# Patient Record
Sex: Female | Born: 1955 | Race: Black or African American | Hispanic: No | Marital: Married | Smoking: Never smoker
Health system: Southern US, Community
[De-identification: ages and names within clinical notes are randomized; demographics above are authoritative.]

## PROBLEM LIST (undated history)

## (undated) DIAGNOSIS — J42 Unspecified chronic bronchitis: Secondary | ICD-10-CM

## (undated) DIAGNOSIS — S83241A Other tear of medial meniscus, current injury, right knee, initial encounter: Secondary | ICD-10-CM

## (undated) DIAGNOSIS — S83249A Other tear of medial meniscus, current injury, unspecified knee, initial encounter: Secondary | ICD-10-CM

---

## 1997-09-21 ENCOUNTER — Other Ambulatory Visit: Admission: RE | Admit: 1997-09-21 | Discharge: 1997-09-21 | Payer: Self-pay | Admitting: Obstetrics

## 1998-10-09 ENCOUNTER — Other Ambulatory Visit: Admission: RE | Admit: 1998-10-09 | Discharge: 1998-10-09 | Payer: Self-pay | Admitting: Obstetrics

## 1998-12-19 ENCOUNTER — Encounter: Payer: Self-pay | Admitting: Internal Medicine

## 1998-12-19 ENCOUNTER — Encounter: Admission: RE | Admit: 1998-12-19 | Discharge: 1998-12-19 | Payer: Self-pay | Admitting: Internal Medicine

## 1999-01-04 HISTORY — PX: BREAST EXCISIONAL BIOPSY: SUR124

## 1999-01-23 ENCOUNTER — Ambulatory Visit (HOSPITAL_BASED_OUTPATIENT_CLINIC_OR_DEPARTMENT_OTHER): Admission: RE | Admit: 1999-01-23 | Discharge: 1999-01-23 | Payer: Self-pay | Admitting: General Surgery

## 1999-09-19 ENCOUNTER — Other Ambulatory Visit: Admission: RE | Admit: 1999-09-19 | Discharge: 1999-09-19 | Payer: Self-pay | Admitting: Obstetrics

## 2003-09-26 ENCOUNTER — Other Ambulatory Visit: Admission: RE | Admit: 2003-09-26 | Discharge: 2003-09-26 | Payer: Self-pay | Admitting: Internal Medicine

## 2004-09-28 ENCOUNTER — Other Ambulatory Visit: Admission: RE | Admit: 2004-09-28 | Discharge: 2004-09-28 | Payer: Self-pay | Admitting: Internal Medicine

## 2005-10-21 ENCOUNTER — Other Ambulatory Visit: Admission: RE | Admit: 2005-10-21 | Discharge: 2005-10-21 | Payer: Self-pay | Admitting: *Deleted

## 2006-10-23 ENCOUNTER — Other Ambulatory Visit: Admission: RE | Admit: 2006-10-23 | Discharge: 2006-10-23 | Payer: Self-pay | Admitting: *Deleted

## 2008-05-05 ENCOUNTER — Ambulatory Visit (HOSPITAL_COMMUNITY): Admission: RE | Admit: 2008-05-05 | Discharge: 2008-05-05 | Payer: Self-pay | Admitting: Obstetrics & Gynecology

## 2008-08-08 ENCOUNTER — Emergency Department (HOSPITAL_COMMUNITY): Admission: EM | Admit: 2008-08-08 | Discharge: 2008-08-08 | Payer: Self-pay | Admitting: Family Medicine

## 2008-09-28 ENCOUNTER — Other Ambulatory Visit: Admission: RE | Admit: 2008-09-28 | Discharge: 2008-09-28 | Payer: Self-pay | Admitting: Family Medicine

## 2009-01-17 ENCOUNTER — Ambulatory Visit (HOSPITAL_COMMUNITY): Admission: RE | Admit: 2009-01-17 | Discharge: 2009-01-17 | Payer: Self-pay | Admitting: Gastroenterology

## 2009-05-10 ENCOUNTER — Ambulatory Visit (HOSPITAL_COMMUNITY): Admission: RE | Admit: 2009-05-10 | Discharge: 2009-05-10 | Payer: Self-pay | Admitting: Family Medicine

## 2009-10-11 ENCOUNTER — Other Ambulatory Visit: Admission: RE | Admit: 2009-10-11 | Discharge: 2009-10-11 | Payer: Self-pay | Admitting: Family Medicine

## 2010-03-20 ENCOUNTER — Other Ambulatory Visit (HOSPITAL_COMMUNITY): Payer: Self-pay | Admitting: Physician Assistant

## 2010-03-20 DIAGNOSIS — Z1231 Encounter for screening mammogram for malignant neoplasm of breast: Secondary | ICD-10-CM

## 2010-05-06 LAB — URINE CULTURE: Colony Count: 70000

## 2010-05-06 LAB — POCT URINALYSIS DIP (DEVICE)
Bilirubin Urine: NEGATIVE
Glucose, UA: NEGATIVE mg/dL
Ketones, ur: 15 mg/dL — AB
Nitrite: NEGATIVE
Protein, ur: NEGATIVE mg/dL
Specific Gravity, Urine: 1.02 (ref 1.005–1.030)
Urobilinogen, UA: 0.2 mg/dL (ref 0.0–1.0)
pH: 7 (ref 5.0–8.0)

## 2010-05-15 ENCOUNTER — Ambulatory Visit (HOSPITAL_COMMUNITY): Payer: Commercial Managed Care - PPO

## 2010-05-18 ENCOUNTER — Ambulatory Visit (HOSPITAL_COMMUNITY)
Admission: RE | Admit: 2010-05-18 | Discharge: 2010-05-18 | Disposition: A | Discharge status: Home / Self Care | Payer: 59 | Source: Ambulatory Visit | Attending: Physician Assistant | Admitting: Physician Assistant

## 2010-05-18 DIAGNOSIS — Z1231 Encounter for screening mammogram for malignant neoplasm of breast: Secondary | ICD-10-CM | POA: Insufficient documentation

## 2010-05-22 ENCOUNTER — Ambulatory Visit
Payer: PRIVATE HEALTH INSURANCE | Attending: Occupational Medicine | Admitting: Rehabilitative and Restorative Service Providers"

## 2010-05-22 DIAGNOSIS — IMO0001 Reserved for inherently not codable concepts without codable children: Secondary | ICD-10-CM | POA: Insufficient documentation

## 2010-05-22 DIAGNOSIS — M545 Low back pain, unspecified: Secondary | ICD-10-CM | POA: Insufficient documentation

## 2010-05-22 DIAGNOSIS — M2569 Stiffness of other specified joint, not elsewhere classified: Secondary | ICD-10-CM | POA: Insufficient documentation

## 2010-05-25 ENCOUNTER — Ambulatory Visit: Payer: PRIVATE HEALTH INSURANCE | Admitting: Rehabilitative and Restorative Service Providers"

## 2010-05-31 ENCOUNTER — Ambulatory Visit
Payer: PRIVATE HEALTH INSURANCE | Attending: Occupational Medicine | Admitting: Rehabilitative and Restorative Service Providers"

## 2010-05-31 DIAGNOSIS — M545 Low back pain, unspecified: Secondary | ICD-10-CM | POA: Insufficient documentation

## 2010-05-31 DIAGNOSIS — M2569 Stiffness of other specified joint, not elsewhere classified: Secondary | ICD-10-CM | POA: Insufficient documentation

## 2010-05-31 DIAGNOSIS — IMO0001 Reserved for inherently not codable concepts without codable children: Secondary | ICD-10-CM | POA: Insufficient documentation

## 2010-06-01 ENCOUNTER — Ambulatory Visit: Payer: PRIVATE HEALTH INSURANCE | Admitting: Physical Therapy

## 2010-06-04 ENCOUNTER — Ambulatory Visit: Payer: PRIVATE HEALTH INSURANCE | Admitting: Rehabilitative and Restorative Service Providers"

## 2010-06-06 ENCOUNTER — Ambulatory Visit: Payer: PRIVATE HEALTH INSURANCE | Admitting: Rehabilitative and Restorative Service Providers"

## 2010-06-11 ENCOUNTER — Ambulatory Visit: Payer: PRIVATE HEALTH INSURANCE | Admitting: Rehabilitative and Restorative Service Providers"

## 2010-06-13 ENCOUNTER — Ambulatory Visit: Payer: PRIVATE HEALTH INSURANCE | Admitting: Rehabilitative and Restorative Service Providers"

## 2010-09-14 ENCOUNTER — Emergency Department (HOSPITAL_COMMUNITY)
Admission: EM | Admit: 2010-09-14 | Discharge: 2010-09-14 | Disposition: A | Discharge status: Home / Self Care | Payer: 59 | Attending: Emergency Medicine | Admitting: Emergency Medicine

## 2010-09-14 DIAGNOSIS — R21 Rash and other nonspecific skin eruption: Secondary | ICD-10-CM | POA: Insufficient documentation

## 2010-09-14 DIAGNOSIS — X58XXXA Exposure to other specified factors, initial encounter: Secondary | ICD-10-CM | POA: Insufficient documentation

## 2010-09-14 DIAGNOSIS — T7840XA Allergy, unspecified, initial encounter: Secondary | ICD-10-CM | POA: Insufficient documentation

## 2010-10-19 ENCOUNTER — Other Ambulatory Visit: Payer: Self-pay | Admitting: Physician Assistant

## 2010-10-19 ENCOUNTER — Other Ambulatory Visit (HOSPITAL_COMMUNITY)
Admission: RE | Admit: 2010-10-19 | Discharge: 2010-10-19 | Disposition: A | Discharge status: Home / Self Care | Payer: 59 | Source: Ambulatory Visit | Attending: Family Medicine | Admitting: Family Medicine

## 2010-10-19 DIAGNOSIS — Z124 Encounter for screening for malignant neoplasm of cervix: Secondary | ICD-10-CM | POA: Insufficient documentation

## 2011-03-27 ENCOUNTER — Other Ambulatory Visit (HOSPITAL_COMMUNITY): Payer: Self-pay | Admitting: Physician Assistant

## 2011-03-27 DIAGNOSIS — Z1231 Encounter for screening mammogram for malignant neoplasm of breast: Secondary | ICD-10-CM

## 2011-05-22 ENCOUNTER — Ambulatory Visit (HOSPITAL_COMMUNITY)
Admission: RE | Admit: 2011-05-22 | Discharge: 2011-05-22 | Disposition: A | Discharge status: Home / Self Care | Payer: 59 | Source: Ambulatory Visit | Attending: Physician Assistant | Admitting: Physician Assistant

## 2011-05-22 DIAGNOSIS — Z1231 Encounter for screening mammogram for malignant neoplasm of breast: Secondary | ICD-10-CM | POA: Insufficient documentation

## 2011-06-10 ENCOUNTER — Other Ambulatory Visit: Payer: Self-pay | Admitting: Occupational Medicine

## 2011-06-10 ENCOUNTER — Ambulatory Visit: Payer: Self-pay

## 2011-06-10 DIAGNOSIS — R52 Pain, unspecified: Secondary | ICD-10-CM

## 2011-06-12 ENCOUNTER — Other Ambulatory Visit (HOSPITAL_COMMUNITY): Payer: Self-pay | Admitting: Family Medicine

## 2011-06-12 DIAGNOSIS — M25462 Effusion, left knee: Secondary | ICD-10-CM

## 2011-06-14 ENCOUNTER — Other Ambulatory Visit (HOSPITAL_COMMUNITY): Payer: Self-pay

## 2011-10-23 ENCOUNTER — Other Ambulatory Visit: Payer: Self-pay | Admitting: Physician Assistant

## 2011-10-23 ENCOUNTER — Other Ambulatory Visit (HOSPITAL_COMMUNITY)
Admission: RE | Admit: 2011-10-23 | Discharge: 2011-10-23 | Disposition: A | Discharge status: Home / Self Care | Payer: 59 | Source: Ambulatory Visit | Attending: Family Medicine | Admitting: Family Medicine

## 2011-10-23 DIAGNOSIS — Z124 Encounter for screening for malignant neoplasm of cervix: Secondary | ICD-10-CM | POA: Insufficient documentation

## 2012-04-20 ENCOUNTER — Other Ambulatory Visit (HOSPITAL_COMMUNITY): Payer: Self-pay | Admitting: Physician Assistant

## 2012-04-20 DIAGNOSIS — Z803 Family history of malignant neoplasm of breast: Secondary | ICD-10-CM

## 2012-05-22 ENCOUNTER — Ambulatory Visit (HOSPITAL_COMMUNITY)
Admission: RE | Admit: 2012-05-22 | Discharge: 2012-05-22 | Disposition: A | Discharge status: Home / Self Care | Payer: 59 | Source: Ambulatory Visit | Attending: Physician Assistant | Admitting: Physician Assistant

## 2012-05-22 DIAGNOSIS — Z1231 Encounter for screening mammogram for malignant neoplasm of breast: Secondary | ICD-10-CM | POA: Insufficient documentation

## 2012-05-22 DIAGNOSIS — Z803 Family history of malignant neoplasm of breast: Secondary | ICD-10-CM

## 2013-04-19 ENCOUNTER — Other Ambulatory Visit (HOSPITAL_COMMUNITY): Payer: Self-pay | Admitting: Physician Assistant

## 2013-04-19 DIAGNOSIS — Z1231 Encounter for screening mammogram for malignant neoplasm of breast: Secondary | ICD-10-CM

## 2013-05-24 ENCOUNTER — Ambulatory Visit (HOSPITAL_COMMUNITY)
Admission: RE | Admit: 2013-05-24 | Discharge: 2013-05-24 | Disposition: A | Discharge status: Home / Self Care | Payer: 59 | Source: Ambulatory Visit | Attending: Physician Assistant | Admitting: Physician Assistant

## 2013-05-24 DIAGNOSIS — Z1231 Encounter for screening mammogram for malignant neoplasm of breast: Secondary | ICD-10-CM | POA: Insufficient documentation

## 2013-06-29 ENCOUNTER — Ambulatory Visit
Admission: RE | Admit: 2013-06-29 | Discharge: 2013-06-29 | Disposition: A | Discharge status: Home / Self Care | Payer: 59 | Source: Ambulatory Visit | Attending: Physician Assistant | Admitting: Physician Assistant

## 2013-06-29 ENCOUNTER — Other Ambulatory Visit: Payer: Self-pay | Admitting: Physician Assistant

## 2013-06-29 DIAGNOSIS — M25562 Pain in left knee: Secondary | ICD-10-CM

## 2013-07-09 ENCOUNTER — Other Ambulatory Visit (HOSPITAL_COMMUNITY): Payer: Self-pay | Admitting: Sports Medicine

## 2013-07-09 DIAGNOSIS — M25562 Pain in left knee: Secondary | ICD-10-CM

## 2013-07-14 ENCOUNTER — Ambulatory Visit (HOSPITAL_COMMUNITY)
Admission: RE | Admit: 2013-07-14 | Discharge: 2013-07-14 | Disposition: A | Discharge status: Home / Self Care | Payer: 59 | Source: Ambulatory Visit | Attending: Sports Medicine | Admitting: Sports Medicine

## 2013-07-14 DIAGNOSIS — M2390 Unspecified internal derangement of unspecified knee: Secondary | ICD-10-CM | POA: Insufficient documentation

## 2013-07-14 DIAGNOSIS — M25562 Pain in left knee: Secondary | ICD-10-CM

## 2013-07-14 DIAGNOSIS — M25469 Effusion, unspecified knee: Secondary | ICD-10-CM | POA: Insufficient documentation

## 2013-07-14 DIAGNOSIS — M25569 Pain in unspecified knee: Secondary | ICD-10-CM | POA: Insufficient documentation

## 2013-11-03 ENCOUNTER — Other Ambulatory Visit (HOSPITAL_COMMUNITY)
Admission: RE | Admit: 2013-11-03 | Discharge: 2013-11-03 | Disposition: A | Discharge status: Home / Self Care | Payer: 59 | Source: Ambulatory Visit | Attending: Family Medicine | Admitting: Family Medicine

## 2013-11-03 DIAGNOSIS — Z124 Encounter for screening for malignant neoplasm of cervix: Secondary | ICD-10-CM | POA: Insufficient documentation

## 2013-11-04 ENCOUNTER — Other Ambulatory Visit: Payer: Self-pay | Admitting: Physician Assistant

## 2013-11-10 LAB — CYTOLOGY - PAP

## 2014-05-02 ENCOUNTER — Other Ambulatory Visit (HOSPITAL_COMMUNITY): Payer: Self-pay | Admitting: Physician Assistant

## 2014-05-02 DIAGNOSIS — Z1231 Encounter for screening mammogram for malignant neoplasm of breast: Secondary | ICD-10-CM

## 2014-05-13 ENCOUNTER — Ambulatory Visit (HOSPITAL_COMMUNITY): Payer: 59

## 2014-05-27 ENCOUNTER — Ambulatory Visit (HOSPITAL_COMMUNITY)
Admission: RE | Admit: 2014-05-27 | Discharge: 2014-05-27 | Disposition: A | Discharge status: Home / Self Care | Payer: 59 | Source: Ambulatory Visit | Attending: Physician Assistant | Admitting: Physician Assistant

## 2014-05-27 DIAGNOSIS — Z1231 Encounter for screening mammogram for malignant neoplasm of breast: Secondary | ICD-10-CM | POA: Diagnosis present

## 2014-06-04 ENCOUNTER — Emergency Department (HOSPITAL_COMMUNITY)
Admission: EM | Admit: 2014-06-04 | Discharge: 2014-06-04 | Disposition: A | Discharge status: Home / Self Care | Payer: 59 | Source: Home / Self Care | Attending: Family Medicine | Admitting: Family Medicine

## 2014-06-04 ENCOUNTER — Encounter (HOSPITAL_COMMUNITY): Payer: Self-pay | Admitting: *Deleted

## 2014-06-04 DIAGNOSIS — R0982 Postnasal drip: Secondary | ICD-10-CM | POA: Diagnosis not present

## 2014-06-04 DIAGNOSIS — J069 Acute upper respiratory infection, unspecified: Secondary | ICD-10-CM | POA: Diagnosis not present

## 2014-06-04 DIAGNOSIS — B9789 Other viral agents as the cause of diseases classified elsewhere: Principal | ICD-10-CM

## 2014-06-04 HISTORY — DX: Unspecified chronic bronchitis: J42

## 2014-06-04 MED ORDER — GUAIFENESIN-CODEINE 100-10 MG/5ML PO SOLN: 5.0000 mL | Freq: Four times a day (QID) | ORAL | Status: DC | PRN

## 2014-06-04 MED ORDER — IPRATROPIUM BROMIDE 0.06 % NA SOLN: 2.0000 | Freq: Four times a day (QID) | NASAL | Status: DC

## 2014-06-04 MED ORDER — FLUTICASONE PROPIONATE 50 MCG/ACT NA SUSP: 2.0000 | Freq: Every day | NASAL | Status: DC

## 2014-06-04 NOTE — ED Notes (Signed)
Started with sore throat 3 days ago; now has productive cough, chest congestion, and c/o wheezing.  Has hx chronic bronchitis.  Does not use inhalers.  Has been taking Nyquil & Advil Cold & Sinus.  Denies fevers.

## 2014-06-04 NOTE — ED Provider Notes (Signed)
CSN: [id number]     Arrival date & time 06/04/14  0915 History   First MD Initiated Contact with Patient 06/04/14 [phone]     Chief Complaint  Patient presents with  . Cough  . Wheezing   (Consider location/radiation/quality/duration/timing/severity/associated sxs/prior Treatment) HPI 5 days ago developed sore throat. Associated w/ runny nose and cough. Worse at night. Sore throat stopped a couple days ago. Slowly improving. Advil, mucinex, and nyquil w/ minimal improvement. Denies fevers, shortness of breath, chest pain, palpitations, dyspnea, syncope, decreased appetite and oral intake, abdominal pain, nausea, vomiting, diarrhea, dysuria, frequency, back pain.   Past Medical History  Diagnosis Date  . Chronic bronchitis    History reviewed. No pertinent past surgical history. Family History  Problem Relation Age of Onset  . Stroke Mother   . Heart failure Father    History  Substance Use Topics  . Smoking status: Never Smoker   . Smokeless tobacco: Not on file  . Alcohol Use: No   OB History    No data available     Review of Systems Per HPI with all other pertinent systems negative.   Allergies  Review of patient's allergies indicates no known allergies.  Home Medications   Prior to Admission medications   Medication Sig Start Date End Date Taking? Authorizing Provider  Calcium Carbonate-Vitamin D (CALCIUM + D PO) Take by mouth daily.   Yes Historical Provider, MD  Omega-3 Fatty Acids (FISH OIL) 1000 MG CAPS Take by mouth daily.   Yes Historical Provider, MD  Ospemifene 60 MG TABS Take by mouth daily.   Yes Historical Provider, MD  Prenatal Vit-Fe Fumarate-FA (M-VIT PO) Take by mouth daily.   Yes Historical Provider, MD  fluticasone (FLONASE) 50 MCG/ACT nasal spray Place 2 sprays into both nostrils at bedtime. 06/04/14   Waldemar Dickens, MD  guaiFENesin-codeine 100-10 MG/5ML syrup Take 5-10 mLs by mouth every 6 (six) hours as needed for cough. 06/04/14   Waldemar Dickens, MD   ipratropium (ATROVENT) 0.06 % nasal spray Place 2 sprays into both nostrils 4 (four) times daily. 06/04/14   Waldemar Dickens, MD   BP 138/82 mmHg  Pulse 90  Temp(Src) 98.8 F (37.1 C) (Oral)  Resp 16  SpO2 95% Physical Exam Physical Exam  Constitutional: oriented to person, place, and time. appears well-developed and well-nourished. No distress.  HENT:  Head: Normocephalic and atraumatic.  Boggy nasal turbinates, no stridor, minimal pharyngeal cobblestoning with 0-1+ tonsils without exudate. Eyes: EOMI. PERRL.  Neck: Normal range of motion.  Cardiovascular: RRR, no m/r/g, 2+ distal pulses,  Pulmonary/Chest: Effort normal and breath sounds normal. No respiratory distress.  Abdominal: Soft. Bowel sounds are normal. NonTTP, no distension.  Musculoskeletal: Normal range of motion. Non ttp, no effusion.  Neurological: alert and oriented to person, place, and time.  Skin: Skin is warm. No rash noted. non diaphoretic.  Psychiatric: normal mood and affect. behavior is normal. Judgment and thought content normal.   ED Course  Procedures (including critical care time) Labs Review Labs Reviewed - No data to display  Imaging Review No results found.   MDM   1. Viral URI with cough   2. Post-nasal drip    No need for antibiotics. Start nasal Atrovent, Flonase, Robitussin-AC, daily allergy pill such as Zyrtec.    Waldemar Dickens, MD 06/04/14 [phone]

## 2014-06-04 NOTE — Discharge Instructions (Signed)
The majority of her symptoms are caused by a viral upper respiratory tract infection which causes postnasal drip and drainage into the lungs causing the cough. Please rate using the nasal Atrovent to help dry paranasal secretions, Flonase at night to decrease her nasal inflammation, a daily allergy pill such as Zyrtec or Allegra, and the cough medicine at night to help you sleep. This should continue to improve everyday. There is no signs of pneumonia or severe bronchitis requiring antibiotics. Please let us know if you get worse.

## 2014-06-04 NOTE — ED Notes (Addendum)
eval this patient on arrival w c/o  "wheezing" states she has been sick x 1 week w cough, feels as if this is in her chest, and is developing bronchitis, and has started to wheeze. Speaking in complete sentences w/o observable SOB or distress

## 2014-12-28 ENCOUNTER — Other Ambulatory Visit (HOSPITAL_COMMUNITY): Payer: Self-pay | Admitting: Sports Medicine

## 2014-12-28 DIAGNOSIS — M25561 Pain in right knee: Secondary | ICD-10-CM

## 2015-01-05 ENCOUNTER — Ambulatory Visit (HOSPITAL_COMMUNITY)
Admission: RE | Admit: 2015-01-05 | Discharge: 2015-01-05 | Disposition: A | Discharge status: Home / Self Care | Payer: 59 | Source: Ambulatory Visit | Attending: Sports Medicine | Admitting: Sports Medicine

## 2015-01-05 DIAGNOSIS — S83241A Other tear of medial meniscus, current injury, right knee, initial encounter: Secondary | ICD-10-CM | POA: Diagnosis not present

## 2015-01-05 DIAGNOSIS — M25461 Effusion, right knee: Secondary | ICD-10-CM | POA: Insufficient documentation

## 2015-01-05 DIAGNOSIS — X58XXXA Exposure to other specified factors, initial encounter: Secondary | ICD-10-CM | POA: Insufficient documentation

## 2015-01-05 DIAGNOSIS — M25561 Pain in right knee: Secondary | ICD-10-CM | POA: Insufficient documentation

## 2015-01-05 DIAGNOSIS — M7121 Synovial cyst of popliteal space [Baker], right knee: Secondary | ICD-10-CM | POA: Diagnosis not present

## 2015-01-10 ENCOUNTER — Encounter (HOSPITAL_BASED_OUTPATIENT_CLINIC_OR_DEPARTMENT_OTHER): Payer: Self-pay | Admitting: *Deleted

## 2015-01-10 DIAGNOSIS — J42 Unspecified chronic bronchitis: Secondary | ICD-10-CM | POA: Diagnosis present

## 2015-01-10 DIAGNOSIS — S83249A Other tear of medial meniscus, current injury, unspecified knee, initial encounter: Secondary | ICD-10-CM | POA: Diagnosis present

## 2015-01-10 DIAGNOSIS — S83241A Other tear of medial meniscus, current injury, right knee, initial encounter: Secondary | ICD-10-CM | POA: Diagnosis present

## 2015-01-10 NOTE — H&P (Signed)
Monique Jimenez is an 59 y.o. female.   Chief Complaint: right knee pain and catching HPI: Monique Jimenez comes to the office with concerns mostly about her right knee.  Swelling, stiffness, deep seated pain with  popping and catching.  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  Previous injection this fall gave her some transient improvement but nothing significant.  She is frustrated with continued knee pain. MRI radial tear of the medial meniscus  Past Medical History  Diagnosis Date  . Chronic bronchitis ([place])   . Medial meniscus tear     right  . Acute medial meniscus tear of right knee     History reviewed. No pertinent past surgical history.  Family History  Problem Relation Age of Onset  . Stroke Mother   . Heart failure Father    Social History:  reports that she has never smoked. She does not have any smokeless tobacco history on file. She reports that she does not drink alcohol or use illicit drugs.  Allergies: No Known Allergies  No current facility-administered medications for this encounter.  Current outpatient prescriptions:  .  Calcium Carbonate-Vitamin D (CALCIUM + D PO), Take by mouth daily., Disp: , Rfl:  .  Omega-3 Fatty Acids (FISH OIL) 1000 MG CAPS, Take by mouth daily., Disp: , Rfl:  .  Prenatal Vit-Fe Fumarate-FA (M-VIT PO), Take by mouth daily., Disp: , Rfl:  No prescriptions prior to admission    No results found for this or any previous visit (from the past 48 hour(s)). No results found.  Review of Systems  Constitutional: Negative.   HENT: Negative.   Eyes: Negative.   Respiratory: Negative.   Cardiovascular: Negative.   Gastrointestinal: Negative.   Genitourinary: Negative.   Musculoskeletal: Positive for joint pain.       Right knee pain and clicking  Skin: Negative.   Neurological: Negative.   Endo/Heme/Allergies: Negative.   Psychiatric/Behavioral: Negative.     Height 5\' 2"  (1.575  m), weight 74.39 kg (164 lb). Physical Exam  Constitutional: She is oriented to person, place, and time. She appears well-developed and well-nourished.  HENT:  Head: Normocephalic and atraumatic.  Eyes: Conjunctivae and EOM are normal. Pupils are equal, round, and reactive to light.  Neck: Neck supple.  Cardiovascular: Normal rate.   Respiratory: Effort normal.  GI: Soft.  Genitourinary:  Not pertinent to current symptomatology therefore not examined.  Musculoskeletal:  Examination of the right knee shows a trace to 1+ effusion, 1+ retropatellar grind, discreet tenderness of the medial joint line.  Positive McMurray.  Full motion, pain to flexion.  The knees is stable.  She is neurovascularly intact distally.    Neurological: She is alert and oriented to person, place, and time.  Skin: Skin is warm and dry.  Psychiatric: She has a normal mood and affect. Her behavior is normal.     Assessment Principal Problem:   Acute medial meniscus tear of right knee Active Problems:   Medial meniscus tear   Chronic bronchitis (HCC)   Plan Right knee arthroscopy with partial medial meniscectomy.   The risks, benefits, and possible complications of the procedure were discussed in detail with the patient.  The patient is without question.  Shirlette Scarber J 01/10/2015, 2:10 PM

## 2015-01-11 ENCOUNTER — Encounter (HOSPITAL_BASED_OUTPATIENT_CLINIC_OR_DEPARTMENT_OTHER)
Admission: RE | Disposition: A | Discharge status: Home / Self Care | Payer: Self-pay | Source: Ambulatory Visit | Attending: Orthopedic Surgery

## 2015-01-11 ENCOUNTER — Encounter (HOSPITAL_BASED_OUTPATIENT_CLINIC_OR_DEPARTMENT_OTHER): Payer: Self-pay | Admitting: *Deleted

## 2015-01-11 ENCOUNTER — Ambulatory Visit (HOSPITAL_BASED_OUTPATIENT_CLINIC_OR_DEPARTMENT_OTHER): Payer: 59 | Admitting: Certified Registered"

## 2015-01-11 ENCOUNTER — Ambulatory Visit (HOSPITAL_BASED_OUTPATIENT_CLINIC_OR_DEPARTMENT_OTHER)
Admission: RE | Admit: 2015-01-11 | Discharge: 2015-01-11 | Disposition: A | Discharge status: Home / Self Care | Payer: 59 | Source: Ambulatory Visit | Attending: Orthopedic Surgery | Admitting: Orthopedic Surgery

## 2015-01-11 DIAGNOSIS — M23221 Derangement of posterior horn of medial meniscus due to old tear or injury, right knee: Secondary | ICD-10-CM | POA: Insufficient documentation

## 2015-01-11 DIAGNOSIS — J42 Unspecified chronic bronchitis: Secondary | ICD-10-CM | POA: Insufficient documentation

## 2015-01-11 DIAGNOSIS — Z79899 Other long term (current) drug therapy: Secondary | ICD-10-CM | POA: Insufficient documentation

## 2015-01-11 DIAGNOSIS — S83241A Other tear of medial meniscus, current injury, right knee, initial encounter: Secondary | ICD-10-CM | POA: Diagnosis present

## 2015-01-11 DIAGNOSIS — S83249A Other tear of medial meniscus, current injury, unspecified knee, initial encounter: Secondary | ICD-10-CM | POA: Diagnosis present

## 2015-01-11 DIAGNOSIS — M94261 Chondromalacia, right knee: Secondary | ICD-10-CM | POA: Insufficient documentation

## 2015-01-11 DIAGNOSIS — M23251 Derangement of posterior horn of lateral meniscus due to old tear or injury, right knee: Secondary | ICD-10-CM | POA: Insufficient documentation

## 2015-01-11 HISTORY — DX: Other tear of medial meniscus, current injury, right knee, initial encounter: S83.241A

## 2015-01-11 HISTORY — PX: KNEE ARTHROSCOPY WITH MEDIAL MENISECTOMY: SHX5651

## 2015-01-11 HISTORY — PX: KNEE ARTHROSCOPY WITH LATERAL MENISECTOMY: SHX6193

## 2015-01-11 HISTORY — DX: Other tear of medial meniscus, current injury, unspecified knee, initial encounter: S83.249A

## 2015-01-11 SURGERY — ARTHROSCOPY, KNEE, WITH MEDIAL MENISCECTOMY
Anesthesia: General | Site: Knee | Laterality: Right

## 2015-01-11 MED ORDER — LIDOCAINE HCL (CARDIAC) 20 MG/ML IV SOLN
INTRAVENOUS | Status: DC | PRN
  Administered 2015-01-11: 60 mg via INTRAVENOUS

## 2015-01-11 MED ORDER — PHENYLEPHRINE HCL 10 MG/ML IJ SOLN
INTRAMUSCULAR | Status: AC
  Filled 2015-01-11: qty 1

## 2015-01-11 MED ORDER — BUPIVACAINE-EPINEPHRINE (PF) 0.5% -1:200000 IJ SOLN
INTRAMUSCULAR | Status: DC | PRN
  Administered 2015-01-11: 30 mL

## 2015-01-11 MED ORDER — MIDAZOLAM HCL 2 MG/2ML IJ SOLN
INTRAMUSCULAR | Status: AC
  Filled 2015-01-11: qty 2

## 2015-01-11 MED ORDER — SODIUM CHLORIDE 0.9 % IR SOLN
Status: DC | PRN
  Administered 2015-01-11: 2000 mL

## 2015-01-11 MED ORDER — HYDROMORPHONE HCL 1 MG/ML IJ SOLN
INTRAMUSCULAR | Status: AC
  Filled 2015-01-11: qty 1

## 2015-01-11 MED ORDER — LIDOCAINE-EPINEPHRINE (PF) 1.5 %-1:200000 IJ SOLN
INTRAMUSCULAR | Status: DC | PRN
  Administered 2015-01-11: 30 mL

## 2015-01-11 MED ORDER — EPHEDRINE SULFATE 50 MG/ML IJ SOLN
INTRAMUSCULAR | Status: DC | PRN
  Administered 2015-01-11: 10 mg via INTRAVENOUS

## 2015-01-11 MED ORDER — MIDAZOLAM HCL 2 MG/2ML IJ SOLN
1.0000 mg | INTRAMUSCULAR | Status: DC | PRN
  Administered 2015-01-11 (×2): 2 mg via INTRAVENOUS

## 2015-01-11 MED ORDER — DEXAMETHASONE SODIUM PHOSPHATE 4 MG/ML IJ SOLN
INTRAMUSCULAR | Status: DC | PRN
  Administered 2015-01-11: 10 mg via INTRAVENOUS

## 2015-01-11 MED ORDER — FENTANYL CITRATE (PF) 100 MCG/2ML IJ SOLN
50.0000 ug | INTRAMUSCULAR | Status: AC | PRN
  Administered 2015-01-11: 100 ug via INTRAVENOUS
  Administered 2015-01-11 (×2): 50 ug via INTRAVENOUS

## 2015-01-11 MED ORDER — PROPOFOL 10 MG/ML IV BOLUS
INTRAVENOUS | Status: DC | PRN
Start: 2015-01-11 — End: 2015-01-11
  Administered 2015-01-11: 150 mg via INTRAVENOUS

## 2015-01-11 MED ORDER — HYDROCODONE-ACETAMINOPHEN 5-325 MG PO TABS: ORAL_TABLET | ORAL | Status: AC

## 2015-01-11 MED ORDER — BUPIVACAINE-EPINEPHRINE (PF) 0.25% -1:200000 IJ SOLN
INTRAMUSCULAR | Status: AC
  Filled 2015-01-11: qty 60

## 2015-01-11 MED ORDER — CHLORHEXIDINE GLUCONATE 4 % EX LIQD: 60.0000 mL | Freq: Once | CUTANEOUS | Status: DC

## 2015-01-11 MED ORDER — EPHEDRINE SULFATE 50 MG/ML IJ SOLN
INTRAMUSCULAR | Status: AC
  Filled 2015-01-11: qty 1

## 2015-01-11 MED ORDER — LACTATED RINGERS IV SOLN
INTRAVENOUS | Status: DC
  Administered 2015-01-11 (×2): via INTRAVENOUS

## 2015-01-11 MED ORDER — MEPERIDINE HCL 25 MG/ML IJ SOLN: 6.2500 mg | INTRAMUSCULAR | Status: DC | PRN

## 2015-01-11 MED ORDER — GLYCOPYRROLATE 0.2 MG/ML IJ SOLN: 0.2000 mg | Freq: Once | INTRAMUSCULAR | Status: DC | PRN

## 2015-01-11 MED ORDER — FENTANYL CITRATE (PF) 100 MCG/2ML IJ SOLN
INTRAMUSCULAR | Status: AC
  Filled 2015-01-11: qty 2

## 2015-01-11 MED ORDER — ONDANSETRON HCL 4 MG/2ML IJ SOLN: 4.0000 mg | Freq: Once | INTRAMUSCULAR | Status: DC | PRN

## 2015-01-11 MED ORDER — CEFAZOLIN SODIUM-DEXTROSE 2-3 GM-% IV SOLR
2.0000 g | INTRAVENOUS | Status: AC
  Administered 2015-01-11: 2 g via INTRAVENOUS

## 2015-01-11 MED ORDER — HYDROMORPHONE HCL 1 MG/ML IJ SOLN
0.2500 mg | INTRAMUSCULAR | Status: DC | PRN
  Administered 2015-01-11 (×3): 0.5 mg via INTRAVENOUS

## 2015-01-11 MED ORDER — SCOPOLAMINE 1 MG/3DAYS TD PT72: 1.0000 | MEDICATED_PATCH | Freq: Once | TRANSDERMAL | Status: DC

## 2015-01-11 MED ORDER — PHENYLEPHRINE HCL 10 MG/ML IJ SOLN
INTRAMUSCULAR | Status: DC | PRN
  Administered 2015-01-11: 80 ug via INTRAVENOUS
  Administered 2015-01-11: 40 ug via INTRAVENOUS

## 2015-01-11 SURGICAL SUPPLY — 56 items
APL SKNCLS STERI-STRIP NONHPOA (GAUZE/BANDAGES/DRESSINGS)
BANDAGE ELASTIC 6 VELCRO ST LF (GAUZE/BANDAGES/DRESSINGS) ×3 IMPLANT
BANDAGE ESMARK 6X9 LF (GAUZE/BANDAGES/DRESSINGS) IMPLANT
BENZOIN TINCTURE PRP APPL 2/3 (GAUZE/BANDAGES/DRESSINGS) IMPLANT
BLADE CUDA GRT WHITE 3.5 (BLADE) ×2 IMPLANT
BLADE CUTTER GATOR 3.5 (BLADE) ×2 IMPLANT
BLADE GREAT WHITE 4.2 (BLADE) ×2 IMPLANT
BLADE SURG 15 STRL LF DISP TIS (BLADE) IMPLANT
BLADE SURG 15 STRL SS (BLADE)
BNDG CMPR 9X6 STRL LF SNTH (GAUZE/BANDAGES/DRESSINGS)
BNDG COHESIVE 4X5 TAN STRL (GAUZE/BANDAGES/DRESSINGS) IMPLANT
BNDG ESMARK 6X9 LF (GAUZE/BANDAGES/DRESSINGS)
DRAPE ARTHROSCOPY W/POUCH 90 (DRAPES) ×3 IMPLANT
DURAPREP 26ML APPLICATOR (WOUND CARE) ×3 IMPLANT
GAUZE SPONGE 4X4 12PLY STRL (GAUZE/BANDAGES/DRESSINGS) ×3 IMPLANT
GAUZE XEROFORM 1X8 LF (GAUZE/BANDAGES/DRESSINGS) ×3 IMPLANT
GLOVE BIO SURGEON STRL SZ7 (GLOVE) ×3 IMPLANT
GLOVE BIOGEL PI IND STRL 7.0 (GLOVE) ×4 IMPLANT
GLOVE BIOGEL PI IND STRL 7.5 (GLOVE) ×2 IMPLANT
GLOVE BIOGEL PI INDICATOR 7.0 (GLOVE) ×3
GLOVE BIOGEL PI INDICATOR 7.5 (GLOVE) ×1
GLOVE ECLIPSE 6.5 STRL STRAW (GLOVE) ×2 IMPLANT
GLOVE SS BIOGEL STRL SZ 7.5 (GLOVE) ×2 IMPLANT
GLOVE SUPERSENSE BIOGEL SZ 7.5 (GLOVE) ×1
GOWN STRL REUS W/ TWL LRG LVL3 (GOWN DISPOSABLE) ×6 IMPLANT
GOWN STRL REUS W/TWL LRG LVL3 (GOWN DISPOSABLE) ×9
HOLDER KNEE FOAM BLUE (MISCELLANEOUS) ×3 IMPLANT
K-WIRE .062X4 (WIRE) IMPLANT
KNEE WRAP E Z 3 GEL PACK (MISCELLANEOUS) ×3 IMPLANT
MANIFOLD NEPTUNE II (INSTRUMENTS) IMPLANT
NDL SAFETY ECLIPSE 18X1.5 (NEEDLE) ×3 IMPLANT
NEEDLE HYPO 18GX1.5 SHARP (NEEDLE) ×3
NEEDLE HYPO 22GX1.5 SAFETY (NEEDLE) IMPLANT
PACK ARTHROSCOPY DSU (CUSTOM PROCEDURE TRAY) ×3 IMPLANT
PACK BASIN DAY SURGERY FS (CUSTOM PROCEDURE TRAY) ×3 IMPLANT
PAD ALCOHOL SWAB (MISCELLANEOUS) ×2 IMPLANT
SET ARTHROSCOPY TUBING (MISCELLANEOUS) ×3
SET ARTHROSCOPY TUBING LN (MISCELLANEOUS) ×2 IMPLANT
STRIP CLOSURE SKIN 1/2X4 (GAUZE/BANDAGES/DRESSINGS) IMPLANT
SUCTION FRAZIER TIP 10 FR DISP (SUCTIONS) IMPLANT
SUT ETHILON 4 0 PS 2 18 (SUTURE) ×3 IMPLANT
SUT FIBERWIRE #2 38 T-5 BLUE (SUTURE)
SUT PDS AB 0 CT 36 (SUTURE) IMPLANT
SUT PROLENE 3 0 PS 2 (SUTURE) IMPLANT
SUT VIC AB 0 CT1 18XCR BRD 8 (SUTURE) IMPLANT
SUT VIC AB 0 CT1 8-18 (SUTURE)
SUT VIC AB 2-0 CT1 27 (SUTURE)
SUT VIC AB 2-0 CT1 TAPERPNT 27 (SUTURE) IMPLANT
SUT VIC AB 3-0 PS1 18 (SUTURE)
SUT VIC AB 3-0 PS1 18XBRD (SUTURE) IMPLANT
SUTURE FIBERWR #2 38 T-5 BLUE (SUTURE) IMPLANT
SYR 20CC LL (SYRINGE) IMPLANT
SYR 5ML LL (SYRINGE) ×3 IMPLANT
TOWEL OR 17X24 6PK STRL BLUE (TOWEL DISPOSABLE) ×3 IMPLANT
WAND STAR VAC 90 (SURGICAL WAND) IMPLANT
WATER STERILE IRR 1000ML POUR (IV SOLUTION) ×3 IMPLANT

## 2015-01-11 NOTE — Anesthesia Preprocedure Evaluation (Signed)
Anesthesia Evaluation  Patient identified by MRN, date of birth, ID band Patient awake    Reviewed: Allergy & Precautions, NPO status , Patient's Chart, lab work & pertinent test results  Airway Mallampati: I  TM Distance: >3 FB Neck ROM: Full    Dental   Pulmonary    Pulmonary exam normal        Cardiovascular Normal cardiovascular exam     Neuro/Psych    GI/Hepatic   Endo/Other    Renal/GU      Musculoskeletal   Abdominal   Peds  Hematology   Anesthesia Other Findings   Reproductive/Obstetrics                             Anesthesia Physical Anesthesia Plan  ASA: II  Anesthesia Plan: General   Post-op Pain Management: MAC Combined w/ Regional for Post-op pain   Induction: Intravenous  Airway Management Planned: LMA  Additional Equipment:   Intra-op Plan:   Post-operative Plan: Extubation in OR  Informed Consent: I have reviewed the patients History and Physical, chart, labs and discussed the procedure including the risks, benefits and alternatives for the proposed anesthesia with the patient or authorized representative who has indicated his/her understanding and acceptance.     Plan Discussed with: CRNA and Surgeon  Anesthesia Plan Comments:         Anesthesia Quick Evaluation

## 2015-01-11 NOTE — Discharge Instructions (Signed)
° ° °  Regional Anesthesia Blocks ° °1. Numbness or the inability to move the "blocked" extremity may last from 3-48 hours after placement. The length of time depends on the medication injected and your individual response to the medication. If the numbness is not going away after 48 hours, call your surgeon. ° °2. The extremity that is blocked will need to be protected until the numbness is gone and the  Strength has returned. Because you cannot feel it, you will need to take extra care to avoid injury. Because it may be weak, you may have difficulty moving it or using it. You may not know what position it is in without looking at it while the block is in effect. ° °3. For blocks in the legs and feet, returning to weight bearing and walking needs to be done carefully. You will need to wait until the numbness is entirely gone and the strength has returned. You should be able to move your leg and foot normally before you try and bear weight or walk. You will need someone to be with you when you first try to ensure you do not fall and possibly risk injury. ° °4. Bruising and tenderness at the needle site are common side effects and will resolve in a few days. ° °5. Persistent numbness or new problems with movement should be communicated to the surgeon or the [facility] ([phone])/  Surgery Center ([phone]). ° ° ° °Post Anesthesia Home Care Instructions ° °Activity: °Get plenty of rest for the remainder of the day. A responsible adult should stay with you for 24 hours following the procedure.  °For the next 24 hours, DO NOT: °-Drive a car °-Operate machinery °-Drink alcoholic beverages °-Take any medication unless instructed by your physician °-Make any legal decisions or sign important papers. ° °Meals: °Start with liquid foods such as gelatin or soup. Progress to regular foods as tolerated. Avoid greasy, spicy, heavy foods. If nausea and/or vomiting occur, drink only clear liquids until  the nausea and/or vomiting subsides. Call your physician if vomiting continues. ° °Special Instructions/Symptoms: °Your throat may feel dry or sore from the anesthesia or the breathing tube placed in your throat during surgery. If this causes discomfort, gargle with warm salt water. The discomfort should disappear within 24 hours. ° °If you had a scopolamine patch placed behind your ear for the management of post- operative nausea and/or vomiting: ° °1. The medication in the patch is effective for 72 hours, after which it should be removed.  Wrap patch in a tissue and discard in the trash. Wash hands thoroughly with soap and water. °2. You may remove the patch earlier than 72 hours if you experience unpleasant side effects which may include dry mouth, dizziness or visual disturbances. °3. Avoid touching the patch. Wash your hands with soap and water after contact with the patch. °  ° °

## 2015-01-11 NOTE — Interval H&P Note (Signed)
History and Physical Interval Note:  01/11/2015 8:01 AM  Monique Jimenez  has presented today for surgery, with the diagnosis of Right knee meniscus tear  The various methods of treatment have been discussed with the patient and family. After consideration of risks, benefits and other options for treatment, the patient has consented to  Procedure(s): RIGHT KNEE ARTHROSCOPY WITH MEDIAL MENISECTOMY (Right) as a surgical intervention .  The patient's history has been reviewed, patient examined, no change in status, stable for surgery.  I have reviewed the patient's chart and labs.  Questions were answered to the patient's satisfaction.     Elsie Saas A

## 2015-01-11 NOTE — Anesthesia Postprocedure Evaluation (Signed)
Anesthesia Post Note  Patient: Monique Jimenez  Procedure(s) Performed: Procedure(s) (LRB): RIGHT KNEE ARTHROSCOPY WITH PARTIAL MEDIAL MENISECTOMY AND PARTIAL  LATERAL MENISECTOMY (Right) KNEE ARTHROSCOPY WITH LATERAL MENISECTOMY (Right)  Patient location during evaluation: PACU Anesthesia Type: General Level of consciousness: awake and alert Pain management: pain level controlled Vital Signs Assessment: post-procedure vital signs reviewed and stable Respiratory status: spontaneous breathing, nonlabored ventilation, respiratory function stable and patient connected to nasal cannula oxygen Cardiovascular status: blood pressure returned to baseline and stable Postop Assessment: no signs of nausea or vomiting Anesthetic complications: no    Last Vitals:  Filed Vitals:   01/11/15 1400 01/11/15 1446  BP: 125/70 135/65  Pulse: 87 73  Temp:  36.7 C  Resp: 14 18    Last Pain:  Filed Vitals:   01/11/15 1447  PainSc: 2                  [name] DAVID

## 2015-01-11 NOTE — Transfer of Care (Signed)
Immediate Anesthesia Transfer of Care Note  Patient: Monique Jimenez  Procedure(s) Performed: Procedure(s): RIGHT KNEE ARTHROSCOPY WITH PARTIAL MEDIAL MENISECTOMY AND PARTIAL  LATERAL MENISECTOMY (Right) KNEE ARTHROSCOPY WITH LATERAL MENISECTOMY (Right)  Patient Location: PACU  Anesthesia Type:GA combined with regional for post-op pain  Level of Consciousness: sedated and pateint uncooperative  Airway & Oxygen Therapy: Patient Spontanous Breathing and Patient connected to face mask oxygen  Post-op Assessment: Report given to RN and Post -op Vital signs reviewed and stable  Post vital signs: Reviewed and stable  Last Vitals:  Filed Vitals:   01/11/15 1336 01/11/15 1337  BP: 123/67   Pulse:  93  Temp:    Resp:  16    Complications: No apparent anesthesia complications

## 2015-01-11 NOTE — Anesthesia Procedure Notes (Addendum)
Anesthesia Regional Block:  Knee block  Pre-Anesthetic Checklist: ,, timeout performed, Correct Patient, Correct Site, Correct Laterality, Correct Procedure, Correct Position, site marked, Risks and benefits discussed,  Surgical consent,  Pre-op evaluation,  At surgeon's request and post-op pain management  Laterality: Right  Prep: Betadine       Needles:  Injection technique: Single-shot  Needle Type: Other     Needle Length: 1.5cm  Needle Gauge: 25 and 25 G    Additional Needles: #22 spinal needleKnee block Narrative:  Start time: 01/11/2015 11:50 AM End time: 01/11/2015 12:00 PM  Performed by: Personally  Anesthesiologist: Lillia Abed  Additional Notes: Time out performed. Informed consent given. Patient sedated.Right knee prepped. Sterile towels placed. Two inferior portals injected. Intraarticular injection performed. Pt tolerated well   Procedure Name: LMA Insertion Date/Time: 01/11/2015 1:06 PM Performed by: Amoni Morales D Pre-anesthesia Checklist: Patient identified, Emergency Drugs available, Suction available and Patient being monitored Patient Re-evaluated:Patient Re-evaluated prior to inductionOxygen Delivery Method: Circle System Utilized Preoxygenation: Pre-oxygenation with 100% oxygen Intubation Type: IV induction Ventilation: Mask ventilation without difficulty LMA: LMA inserted LMA Size: 4.0 Number of attempts: 1 Airway Equipment and Method: Bite block Placement Confirmation: positive ETCO2 Tube secured with: Tape Dental Injury: Teeth and Oropharynx as per pre-operative assessment

## 2015-01-11 NOTE — Progress Notes (Signed)
Assisted Dr. Conrad  with right, knee block. Side rails up, monitors on throughout procedure. See vital signs in flow sheet. Tolerated Procedure well.

## 2015-01-12 ENCOUNTER — Encounter (HOSPITAL_BASED_OUTPATIENT_CLINIC_OR_DEPARTMENT_OTHER): Payer: Self-pay | Admitting: Orthopedic Surgery

## 2015-01-12 NOTE — Op Note (Signed)
NAMEBRYNN, Monique Jimenez                 ACCOUNT NO.:  [id number]  MEDICAL RECORD NO.:  [id number]  LOCATION:                               FACILITY:  [facility]  PHYSICIAN:  Audree Camel. Noemi Chapel, M.D. 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  PROCEDURE:  Right knee examination under anesthesia followed by arthroscopic partial medial and lateral meniscectomies.  SURGEON:  Audree Camel. Noemi Chapel, M.D.  ASSISTANT:  Kirstin Shepperson, PA-C.  ANESTHESIA:  General.  OPERATIVE TIME:  30 minutes.  COMPLICATIONS:  None.  INDICATION FOR PROCEDURE:  Monique Jimenez is a 59 year old woman who has had significant right knee pain for the past 9 to 12 months, increasing in nature with exam and MRI documenting meniscal tearing.  She has failed conservative care and is now to undergo arthroscopy.  DESCRIPTION OF PROCEDURE:  Monique Jimenez was brought to the operating room on January 11, 2015, after knee block was placed in the holding room by Anesthesia.  She was placed on the operative table in a supine position. She received antibiotics preoperatively for prophylaxis.  After being placed under general anesthesia, her right knee was examined.  She had full range of motion.  Knee was stable to ligamentous exam with normal patellar tracking.  The right leg was prepped using sterile DuraPrep and draped using sterile technique.  Time-out procedure was called and the correct right knee identified.  Initially, through an anterolateral portal, the arthroscope with a pump attached was placed into an anteromedial portal, and an arthroscopic probe was placed.  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  Medial meniscus showed tearing of the posterior medial horn of which 50% was resected back to a stable rim. Intercondylar notch was inspected.  Anterior and posterior cruciate ligaments were normal.  Lateral compartment inspected.  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 Lateral meniscus tear in the posterior lateral horn of which 30% was resected back to a stable rim.  Patellofemoral joint articular cartilage normal.  The patella tracked normally.  Medial and lateral gutters were free of pathology.  After this was done, it was felt that all pathology had been satisfactorily addressed.  The instruments were removed. Portals were closed with 3-0 nylon suture.  Sterile dressings were applied.  The patient was awakened and taken to the recovery room in a stable condition.  FOLLOWUP CARE:  Monique Jimenez to be followed as an outpatient on Norco for pain.  She will see [place] back in our office in a week for sutures out and followup.     Ivon Oelkers A. Noemi Chapel, M.D.     RAW/MEDQ  D:  01/11/2015  T:  01/12/2015  Job:  [id number]

## 2015-01-19 ENCOUNTER — Other Ambulatory Visit (HOSPITAL_COMMUNITY): Payer: Self-pay | Admitting: Orthopedic Surgery

## 2015-01-19 DIAGNOSIS — M79604 Pain in right leg: Secondary | ICD-10-CM

## 2015-01-20 ENCOUNTER — Ambulatory Visit: Payer: 59 | Attending: Orthopedic Surgery | Admitting: Physical Therapy

## 2015-01-20 ENCOUNTER — Ambulatory Visit (HOSPITAL_COMMUNITY)
Admission: RE | Admit: 2015-01-20 | Discharge: 2015-01-20 | Disposition: A | Discharge status: Home / Self Care | Payer: 59 | Source: Ambulatory Visit | Attending: Cardiovascular Disease | Admitting: Cardiovascular Disease

## 2015-01-20 DIAGNOSIS — M79604 Pain in right leg: Secondary | ICD-10-CM | POA: Insufficient documentation

## 2015-01-20 DIAGNOSIS — R600 Localized edema: Secondary | ICD-10-CM | POA: Diagnosis present

## 2015-01-20 DIAGNOSIS — M25561 Pain in right knee: Secondary | ICD-10-CM | POA: Diagnosis not present

## 2015-01-20 DIAGNOSIS — R269 Unspecified abnormalities of gait and mobility: Secondary | ICD-10-CM | POA: Insufficient documentation

## 2015-01-20 DIAGNOSIS — R2241 Localized swelling, mass and lump, right lower limb: Secondary | ICD-10-CM | POA: Diagnosis not present

## 2015-01-20 DIAGNOSIS — R29898 Other symptoms and signs involving the musculoskeletal system: Secondary | ICD-10-CM | POA: Diagnosis present

## 2015-01-20 DIAGNOSIS — M25661 Stiffness of right knee, not elsewhere classified: Secondary | ICD-10-CM

## 2015-01-20 NOTE — Therapy (Signed)
[place], [place], [address] Phone: [phone]   Fax:  [phone]  Physical Therapy Evaluation  Patient Details  Name: Monique Jimenez MRN: [id number] Date of Birth: [birth year] Referring Provider: Elsie Saas MD  Encounter Date: 01/20/2015      PT End of Session - 01/20/15 0843    Visit Number 1   Number of Visits 12   Date for PT Re-Evaluation 03/03/15   PT Start Time 0800   PT Stop Time 0843   PT Time Calculation (min) 43 min   Activity Tolerance Patient tolerated treatment well   Behavior During Therapy [facility] for tasks assessed/performed      Past Medical History  Diagnosis Date  . Chronic bronchitis ([place])   . Medial meniscus tear     right  . Acute medial meniscus tear of right knee     Past Surgical History  Procedure Laterality Date  . Knee arthroscopy with medial menisectomy Right 01/11/2015    Procedure: RIGHT KNEE ARTHROSCOPY WITH PARTIAL MEDIAL MENISECTOMY AND PARTIAL  LATERAL MENISECTOMY;  Surgeon: Elsie Saas, MD;  Location: [place];  Service: Orthopedics;  Laterality: Right;  . Knee arthroscopy with lateral menisectomy Right 01/11/2015    Procedure: KNEE ARTHROSCOPY WITH LATERAL MENISECTOMY;  Surgeon: Elsie Saas, MD;  Location: [place];  Service: Orthopedics;  Laterality: Right;    There were no vitals filed for this visit.  Visit Diagnosis:  Right knee pain - Plan: PT plan of care cert/re-cert  Right leg weakness - Plan: PT plan of care cert/re-cert  Local edema - Plan: PT plan of care cert/re-cert  Abnormality of gait - Plan: PT plan of care cert/re-cert  Decreased ROM of right knee - Plan: PT plan of care cert/re-cert      Subjective Assessment - 01/20/15 0807    Subjective pt is a 59 y.o F with s/p R knee Arthroscopic medial and lateral menisectomy on 01/12/2015. since the surgery she reports that everything is going well. she  does report some stiffness in the calf and plans to see her doctor for an Ultra sound to rule out a DVT.    How long can you sit comfortably? unlimited   How long can you stand comfortably? 2 hour   How long can you walk comfortably? 2 hour   Diagnostic tests 01/06/2015 MRI meniscal tear   Patient Stated Goals to be able to walk normally, to decrease pain.    Currently in Pain? No/denies   Pain Score 0-No pain   Pain Location Knee   Pain Orientation Right   Pain Type Surgical pain   Pain Frequency Rarely            [facility] PT Assessment - 01/20/15 0801    Assessment   Medical Diagnosis s/p R knee lateral and medial menisectomy   Referring Provider Elsie Saas MD   Onset Date/Surgical Date 01/12/15   Hand Dominance Right   Next MD Visit 02/12/2014   Prior Therapy yes   Precautions   Precautions None   Restrictions   Weight Bearing Restrictions No   Balance Screen   Has the patient fallen in the past 6 months No   Has the patient had a decrease in activity level because of a fear of falling?  No   Is the patient reluctant to leave their home because of a fear of falling?  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, gardening, planting flowers, walking the dog, bowling   Cognition   Overall Cognitive Status Within Functional Limits for tasks assessed   Observation/Other Assessments   Focus on Therapeutic Outcomes (FOTO)  63% limited  predicte 43% limited   Observation/Other Assessments-Edema    Edema Circumferential   Circumferential Edema    Circumferential - Right at joint line 39.5 cm, 10 cm above 46 , 10cm below 38.5cm   Posture/Postural Control   Posture/Postural Control Postural limitations   Postural Limitations Rounded Shoulders;Forward head   ROM / Strength   AROM / PROM / Strength AROM;PROM;Strength   AROM   AROM Assessment Site Knee   Right/Left Knee Right;Left   Right Knee Extension -10   Right Knee Flexion 100   Left Knee Extension 0   Left Knee Flexion 134   PROM   PROM Assessment Site Knee   Right/Left Knee Right;Left   Right Knee Extension -5   Right Knee Flexion 113   Strength   Strength Assessment Site Knee   Right/Left Knee Right;Left   Right Knee Flexion 4-/5   Right Knee Extension 4-/5   Left Knee Flexion 5/5   Left Knee Extension 5/5   Ambulation/Gait   Gait Pattern Step-through pattern;Decreased stride length;Antalgic;Decreased stance time - right;Decreased step length - left;Decreased weight shift to right                           PT Education - 01/20/15 0843    Education provided Yes   Education Details evaluation findings, POC, Goals, HEP   Person(s) Educated Patient   Methods Explanation   Comprehension Verbalized understanding          PT Short Term Goals - 01/20/15 0849    PT SHORT TERM GOAL #1   Title pt will be I with initial HEP ( 02/10/2015)   Time 3   Period Weeks   Status New   PT SHORT TERM GOAL #2   Title pt will be able to verbalize and demonstrate techniques to reduce R knee inflammation and edema via RICE (02/10/2015)   Time 3   Period Weeks   Status New           PT Long Term Goals - 01/20/15 [id number]    PT LONG TERM GOAL #1   Title pt will be I with all HEP give as of last visit (03/03/2015)   Time 6   Period Weeks   Status New   PT LONG TERM GOAL #2   Title pt will increase her R knee flexion to >/= 120 degrees and extension to 0 with < 2/10 pain  to assist with efficient and functional gait pattern (03/03/2015)   Time 6   Period Weeks    Status New   PT LONG TERM GOAL #3   Title pt will increase R knee strenght to >/=4+/5 to assist with lifting/pushing and pulling associated with work related tasks (03/03/2015)   Time 6   Period Weeks   Status New   PT LONG TERM GOAL #4  Title pt will increase her FOTO score to >/= 50 to demonstrate improved function at discharge ( 03/03/2015)   Time 6   Period Weeks   Status New   PT LONG TERM GOAL #5   Title pt will be able to kneel and get up/ down from the ground independently without assistance with < 2/10 pain for personal goal of gardening again (03/03/2015)   Time 6   Period Weeks   Status New               Plan - 01/20/15 0843    Clinical Impression Statement Sylvai present to OPPT s/p R knee arthroscopic medial/lateral menisectomy on 01/12/2015. the incision site appears to clean, intact and healing well.  She demosntrates limited R knee AROM/ PROM secondary to pain and swelling at end range. MMT reavealed weakness secondary to pain in the R knee during testing.  Palpation revaled tenderness along the incison site and into the calf. Homans sign was negative. she currently ambualtes wihtout an AD with an antalgic gait pattern with limited step length on the L and stance on the R . She would benefit from physical therapy to decrease pain and return to PLOF by addressing the impairments listed.    Rehab Potential Good   PT Frequency 2x / week   PT Duration 6 weeks   PT Treatment/Interventions ADLs/Self Care Home Management;Cryotherapy;Electrical Stimulation;Iontophoresis 4mg /ml Dexamethasone;Moist Heat;Manual techniques;Therapeutic exercise;Therapeutic activities;Vasopneumatic Device;Taping;Dry needling;Passive range of motion;Ultrasound;Patient/family education   PT Next Visit Plan assess response to HEP, knee mobiliity, strengthening, vaso for swelling PRN, gait training   PT Home Exercise Plan see HEP handout   Consulted and Agree with Plan of Care Patient          Problem List Patient Active Problem List   Diagnosis Date Noted  . Acute medial meniscus tear of right knee   . Medial meniscus tear   . Chronic bronchitis ([place])    Starr Lake PT, DPT, LAT, ATC  01/20/2015  8:58 AM     [facility] [address] [place], [place], [address] Phone: [phone]   Fax:  [phone]  Name: Monique Jimenez MRN: [id number] Date of Birth: August 09, 1955

## 2015-01-20 NOTE — Patient Instructions (Signed)
   Prosperity Darrough PT, DPT, LAT, ATC  [facility] Phone: [phone]     

## 2015-01-24 ENCOUNTER — Ambulatory Visit: Payer: 59 | Admitting: Physical Therapy

## 2015-01-24 DIAGNOSIS — R600 Localized edema: Secondary | ICD-10-CM

## 2015-01-24 DIAGNOSIS — M25561 Pain in right knee: Secondary | ICD-10-CM

## 2015-01-24 DIAGNOSIS — R29898 Other symptoms and signs involving the musculoskeletal system: Secondary | ICD-10-CM

## 2015-01-24 DIAGNOSIS — M25661 Stiffness of right knee, not elsewhere classified: Secondary | ICD-10-CM

## 2015-01-24 DIAGNOSIS — R269 Unspecified abnormalities of gait and mobility: Secondary | ICD-10-CM

## 2015-01-24 NOTE — Patient Instructions (Signed)
Gastroc / Heel Cord Stretch - On Step    Stand with heels over edge of stair. Holding rail, lower heels until stretch is felt in calf of legs. Repeat 3___ times. Do _1__ times per day.  Hold 30 seconds.  Copyright  VHI. All rights reserved.

## 2015-01-24 NOTE — Therapy (Signed)
[place], [place], [address] Phone: [phone]   Fax:  [phone]  Physical Therapy Treatment  Patient Details  Name: Monique Jimenez MRN: [id number] Date of Birth: [birth year] Referring Provider: Elsie Saas MD  Encounter Date: 01/24/2015      PT End of Session - 01/24/15 1437    Visit Number 2   Number of Visits 12   Date for PT Re-Evaluation 03/03/15   PT Start Time 0930   PT Stop Time 1030   PT Time Calculation (min) 60 min   Activity Tolerance Patient tolerated treatment well   Behavior During Therapy [facility] for tasks assessed/performed      Past Medical History  Diagnosis Date  . Chronic bronchitis ([place])   . Medial meniscus tear     right  . Acute medial meniscus tear of right knee     Past Surgical History  Procedure Laterality Date  . Knee arthroscopy with medial menisectomy Right 01/11/2015    Procedure: RIGHT KNEE ARTHROSCOPY WITH PARTIAL MEDIAL MENISECTOMY AND PARTIAL  LATERAL MENISECTOMY;  Surgeon: Elsie Saas, MD;  Location: [place];  Service: Orthopedics;  Laterality: Right;  . Knee arthroscopy with lateral menisectomy Right 01/11/2015    Procedure: KNEE ARTHROSCOPY WITH LATERAL MENISECTOMY;  Surgeon: Elsie Saas, MD;  Location: [place];  Service: Orthopedics;  Laterality: Right;    There were no vitals filed for this visit.  Visit Diagnosis:  Right knee pain  Right leg weakness  Local edema  Abnormality of gait  Decreased ROM of right knee      Subjective Assessment - 01/24/15 0938    Subjective No pain.  Had [place] and they found a baker's cyst.     Currently in Pain? No/denies   Pain Location Knee   Pain Orientation Right   Pain Descriptors / Indicators --  stiff   Pain Frequency Intermittent                         [facility] Adult PT Treatment/Exercise - 01/24/15 0949    Knee/Hip Exercises: Stretches   Gastroc  Stretch 3 reps;30 seconds  added to home exercise.   Knee/Hip Exercises: Aerobic   Nustep Level 1, 5 minutes.     Knee/Hip Exercises: Seated   Long Arc Quad Limitations short arc from 4 inch platform   Heel Slides 10 reps   Other Seated Knee/Hip Exercises heel lifts 10 X   Knee/Hip Exercises: Supine   Quad Sets 10 reps   Heel Slides 10 reps  AAROM  painful   Vasopneumatic   Number Minutes Vasopneumatic  15 minutes   Vasopnuematic Location  Knee   Vasopneumatic Pressure Medium   Vasopneumatic Temperature  32                  PT Short Term Goals - 01/24/15 1442    PT SHORT TERM GOAL #1   Title pt will be I with initial HEP ( 02/10/2015)   Baseline minor cues   Time 3   Period Weeks   Status On-going   PT SHORT TERM GOAL #2   Title pt will be able to verbalize and demonstrate techniques to reduce R knee inflammation and edema via RICE (02/10/2015)   Time 3   Period Weeks           PT Long Term Goals - 01/20/15 0851    PT LONG TERM GOAL #1  Title pt will be I with all HEP give as of last visit (03/03/2015)   Time 6   Period Weeks   Status New   PT LONG TERM GOAL #2   Title pt will increase her R knee flexion to >/= 120 degrees and extension to 0 with < 2/10 pain  to assist with efficient and functional gait pattern (03/03/2015)   Time 6   Period Weeks   Status New   PT LONG TERM GOAL #3   Title pt will increase R knee strenght to >/=4+/5 to assist with lifting/pushing and pulling associated with work related tasks (03/03/2015)   Time 6   Period Weeks   Status New   PT LONG TERM GOAL #4   Title pt will increase her FOTO score to >/= 50 to demonstrate improved function at discharge ( 03/03/2015)   Time 6   Period Weeks   Status New   PT LONG TERM GOAL #5   Title pt will be able to kneel and get up/ down from the ground independently without assistance with < 2/10 pain for personal goal of gardening again (03/03/2015)   Time 6   Period Weeks   Status New                Plan - 01/24/15 1440    Clinical Impression Statement SOM and strength improving.  Knee catches painfully intermittantly during session, brief.  Gentle exercises only today.   PT Next Visit Plan Progress exercises as able   PT Home Exercise Plan calf stretch   Consulted and Agree with Plan of Care Patient        Problem List Patient Active Problem List   Diagnosis Date Noted  . Acute medial meniscus tear of right knee   . Medial meniscus tear   . Chronic bronchitis ([place])     Allisha Harter 01/24/2015, 2:43 PM  [facility] [address] [place], [place], [address] Phone: [phone]   Fax:  [phone]  Name: Monique Jimenez MRN: [id number] Date of Birth: 07/11/1955    Melvenia Needles, PTA 01/24/2015 2:43 PM Phone: [phone] Fax: [phone]

## 2015-01-26 ENCOUNTER — Ambulatory Visit: Payer: 59 | Admitting: Physical Therapy

## 2015-01-26 DIAGNOSIS — M25561 Pain in right knee: Secondary | ICD-10-CM | POA: Diagnosis not present

## 2015-01-26 DIAGNOSIS — R269 Unspecified abnormalities of gait and mobility: Secondary | ICD-10-CM

## 2015-01-26 DIAGNOSIS — R29898 Other symptoms and signs involving the musculoskeletal system: Secondary | ICD-10-CM

## 2015-01-26 DIAGNOSIS — R600 Localized edema: Secondary | ICD-10-CM

## 2015-01-26 DIAGNOSIS — M25661 Stiffness of right knee, not elsewhere classified: Secondary | ICD-10-CM

## 2015-01-27 NOTE — Therapy (Signed)
[place], [place], [address] Phone: [phone]   Fax:  [phone]  Physical Therapy Treatment  Patient Details  Name: CENNIE JAKUPOVIC MRN: [id number] Date of Birth: September 26, 1955 Referring Provider: Elsie Saas MD  Encounter Date: 01/26/2015      PT End of Session - 01/26/15 1546    Visit Number 3   Number of Visits 12   Date for PT Re-Evaluation 03/03/15   PT Start Time 0305   PT Stop Time 0400   PT Time Calculation (min) 55 min      Past Medical History  Diagnosis Date  . Chronic bronchitis ([place])   . Medial meniscus tear     right  . Acute medial meniscus tear of right knee     Past Surgical History  Procedure Laterality Date  . Knee arthroscopy with medial menisectomy Right 01/11/2015    Procedure: RIGHT KNEE ARTHROSCOPY WITH PARTIAL MEDIAL MENISECTOMY AND PARTIAL  LATERAL MENISECTOMY;  Surgeon: Elsie Saas, MD;  Location: [place];  Service: Orthopedics;  Laterality: Right;  . Knee arthroscopy with lateral menisectomy Right 01/11/2015    Procedure: KNEE ARTHROSCOPY WITH LATERAL MENISECTOMY;  Surgeon: Elsie Saas, MD;  Location: [place];  Service: Orthopedics;  Laterality: Right;    There were no vitals filed for this visit.  Visit Diagnosis:  Right knee pain  Right leg weakness  Local edema  Abnormality of gait  Decreased ROM of right knee      Subjective Assessment - 01/26/15 1546    Subjective No pain just stiff                         [facility] Adult PT Treatment/Exercise - 01/27/15 0001    Knee/Hip Exercises: Aerobic   Nustep Level 3 x 10 min LE only   Knee/Hip Exercises: Supine   Quad Sets 10 reps   Short Arc Quad Sets 15 reps   Heel Slides AROM;10 reps  painfiul anterior knee   Straight Leg Raises 10 reps;Limitations   Knee/Hip Exercises: Sidelying   Hip ABduction 1 set;10 reps   Knee/Hip Exercises: Prone   Hamstring Curl 20 reps   Hip Extension 1 set;10 reps   Vasopneumatic   Number Minutes Vasopneumatic  15 minutes   Vasopnuematic Location  Knee   Vasopneumatic Pressure Medium   Vasopneumatic Temperature  32                  PT Short Term Goals - 01/24/15 1442    PT SHORT TERM GOAL #1   Title pt will be I with initial HEP ( 02/10/2015)   Baseline minor cues   Time 3   Period Weeks   Status On-going   PT SHORT TERM GOAL #2   Title pt will be able to verbalize and demonstrate techniques to reduce R knee inflammation and edema via RICE (02/10/2015)   Time 3   Period Weeks           PT Long Term Goals - 01/20/15 [id number]    PT LONG TERM GOAL #1   Title pt will be I with all HEP give as of last visit (03/03/2015)   Time 6   Period Weeks   Status New   PT LONG TERM GOAL #2   Title pt will increase her R knee flexion to >/= 120 degrees and extension to 0 with < 2/10 pain  to assist with  efficient and functional gait pattern (03/03/2015)   Time 6   Period Weeks   Status New   PT LONG TERM GOAL #3   Title pt will increase R knee strenght to >/=4+/5 to assist with lifting/pushing and pulling associated with work related tasks (03/03/2015)   Time 6   Period Weeks   Status New   PT LONG TERM GOAL #4   Title pt will increase her FOTO score to >/= 50 to demonstrate improved function at discharge ( 03/03/2015)   Time 6   Period Weeks   Status New   PT LONG TERM GOAL #5   Title pt will be able to kneel and get up/ down from the ground independently without assistance with < 2/10 pain for personal goal of gardening again (03/03/2015)   Time 6   Period Weeks   Status New               Plan - 01/26/15 1550    Clinical Impression Statement Improved tolerance to exercises. continued anterior knee pain and tenderness. Progressing toward exercise goals. Less pain.    PT Next Visit Plan Progress exercises as able- add to HEP        Problem List Patient Active Problem List    Diagnosis Date Noted  . Acute medial meniscus tear of right knee   . Medial meniscus tear   . Chronic bronchitis [facility])     Dorene Ar, [place] 01/27/2015, 9:11 AM  [place], [place], [address] Phone: [phone]   Fax:  [phone]  Name: JANIELYS GOAN MRN: [id number] Date of Birth: [birth year]

## 2015-01-31 ENCOUNTER — Ambulatory Visit: Payer: 59 | Attending: Orthopedic Surgery | Admitting: Physical Therapy

## 2015-01-31 DIAGNOSIS — R600 Localized edema: Secondary | ICD-10-CM | POA: Insufficient documentation

## 2015-01-31 DIAGNOSIS — M25561 Pain in right knee: Secondary | ICD-10-CM | POA: Insufficient documentation

## 2015-01-31 DIAGNOSIS — M25661 Stiffness of right knee, not elsewhere classified: Secondary | ICD-10-CM

## 2015-01-31 DIAGNOSIS — R29898 Other symptoms and signs involving the musculoskeletal system: Secondary | ICD-10-CM | POA: Diagnosis not present

## 2015-01-31 DIAGNOSIS — R269 Unspecified abnormalities of gait and mobility: Secondary | ICD-10-CM | POA: Insufficient documentation

## 2015-01-31 NOTE — Therapy (Signed)
[place], [place], [address] Phone: [phone]   Fax:  [phone]  Physical Therapy Treatment  Patient Details  Name: Monique Jimenez MRN: [id number] Date of Birth: [birth year] Referring Provider: Elsie Saas MD  Encounter Date: 01/31/2015      PT End of Session - 01/31/15 1555    Visit Number 4   Number of Visits 12   Date for PT Re-Evaluation 03/03/15   PT Start Time 1500   PT Stop Time 1545   PT Time Calculation (min) 45 min   Activity Tolerance Patient tolerated treatment well   Behavior During Therapy [facility] for tasks assessed/performed      Past Medical History  Diagnosis Date  . Chronic bronchitis ([place])   . Medial meniscus tear     right  . Acute medial meniscus tear of right knee     Past Surgical History  Procedure Laterality Date  . Knee arthroscopy with medial menisectomy Right 01/11/2015    Procedure: RIGHT KNEE ARTHROSCOPY WITH PARTIAL MEDIAL MENISECTOMY AND PARTIAL  LATERAL MENISECTOMY;  Surgeon: Elsie Saas, MD;  Location: [place];  Service: Orthopedics;  Laterality: Right;  . Knee arthroscopy with lateral menisectomy Right 01/11/2015    Procedure: KNEE ARTHROSCOPY WITH LATERAL MENISECTOMY;  Surgeon: Elsie Saas, MD;  Location: [place];  Service: Orthopedics;  Laterality: Right;    There were no vitals filed for this visit.  Visit Diagnosis:  Right knee pain  Right leg weakness  Local edema  Abnormality of gait  Decreased ROM of right knee      Subjective Assessment - 01/31/15 1505    Subjective "The stiffness is there but I figured the more I move the better I get"    Currently in Pain? No/denies   Pain Location Knee   Pain Orientation Right            [facility] PT Assessment - 01/31/15 1518    AROM   Right Knee Extension -5   Right Knee Flexion 128                     [facility] Adult PT Treatment/Exercise -  01/31/15 1506    Static Standing Balance   Tandem Stance - Right Leg 30  2 x with moderate sway   Tandem Stance - Left Leg 30  2 x with moderate postural sway   Rhomberg - Eyes Opened 30   Rhomberg - Eyes Closed 30   Knee/Hip Exercises: Stretches   Gastroc Stretch 2 reps;30 seconds   Knee/Hip Exercises: Aerobic   Nustep L 7 x 10 min LE and UE   Knee/Hip Exercises: Standing   Heel Raises 2 sets;20 reps;Both  off edge of step   Hip Abduction AROM;Stengthening;1 set;Knee straight;Right;15 reps  2.5#   Hip Extension AROM;Stengthening;1 set;Knee straight;Right;15 reps  2.5#   Knee/Hip Exercises: Seated   Sit to Sand 2 sets;10 reps   Knee/Hip Exercises: Supine   Straight Leg Raises AROM;Strengthening;Right;2 sets;10 reps  with quad set and controlled eccentric lowering   Straight Leg Raises Limitations 2.5#   Knee/Hip Exercises: Sidelying   Hip ABduction 1 set;10 reps                  PT Short Term Goals - 01/31/15 1632    PT SHORT TERM GOAL #1   Title pt will be I with initial HEP ( 02/10/2015)   Baseline minor cues   Time  3   Period Weeks   Status Achieved   PT SHORT TERM GOAL #2   Title pt will be able to verbalize and demonstrate techniques to reduce R knee inflammation and edema via RICE (02/10/2015)   Time 3   Period Weeks   Status Achieved           PT Long Term Goals - 01/31/15 1633    PT LONG TERM GOAL #1   Title pt will be I with all HEP give as of last visit (03/03/2015)   Time 6   Period Weeks   Status On-going   PT LONG TERM GOAL #2   Title pt will increase her R knee flexion to >/= 120 degrees and extension to 0 with < 2/10 pain  to assist with efficient and functional gait pattern (03/03/2015)   Time 6   Period Weeks   Status On-going   PT LONG TERM GOAL #3   Title pt will increase R knee strenght to >/=4+/5 to assist with lifting/pushing and pulling associated with work related tasks (03/03/2015)   Time 6   Period Weeks   Status On-going    PT LONG TERM GOAL #4   Title pt will increase her FOTO score to >/= 50 to demonstrate improved function at discharge ( 03/03/2015)   Time 6   Period Weeks   Status On-going   PT LONG TERM GOAL #5   Title pt will be able to kneel and get up/ down from the ground independently without assistance with < 2/10 pain for personal goal of gardening again (03/03/2015)   Time 6   Period Weeks   Status On-going               Plan - 01/31/15 1630    Clinical Impression Statement Devlyn continues to make progress with improved knee mobility to 128 degrees of flexion. She was able to perform and complete all of todays exercises without complaint. she exhibited increased postural sway with tandem balance activities. she met all STG this visit.  Plan to progress with [facility] activities.    PT Next Visit Plan progress exercises to [facility], and balance training exercises. update HEP   Consulted and Agree with Plan of Care Patient        Problem List Patient Active Problem List   Diagnosis Date Noted  . Acute medial meniscus tear of right knee   . Medial meniscus tear   . Chronic bronchitis ([place])    Starr Lake PT, DPT, LAT, ATC  01/31/2015  4:35 PM      [facility] [address] [place], [place], [address] Phone: [phone]   Fax:  [phone]  Name: BHAKTI LABELLA MRN: [id number] Date of Birth: May 09, 1955

## 2015-02-02 ENCOUNTER — Ambulatory Visit: Payer: 59 | Admitting: Physical Therapy

## 2015-02-02 DIAGNOSIS — M25561 Pain in right knee: Secondary | ICD-10-CM | POA: Diagnosis not present

## 2015-02-02 DIAGNOSIS — M25661 Stiffness of right knee, not elsewhere classified: Secondary | ICD-10-CM

## 2015-02-02 DIAGNOSIS — R600 Localized edema: Secondary | ICD-10-CM

## 2015-02-02 DIAGNOSIS — R29898 Other symptoms and signs involving the musculoskeletal system: Secondary | ICD-10-CM

## 2015-02-02 DIAGNOSIS — R269 Unspecified abnormalities of gait and mobility: Secondary | ICD-10-CM | POA: Diagnosis not present

## 2015-02-02 NOTE — Therapy (Signed)
[place], [place], [address] Phone: [phone]   Fax:  [phone]  Physical Therapy Treatment  Patient Details  Name: Monique Jimenez MRN: [id number] Date of Birth: 06-23-1955 Referring Provider: Elsie Saas MD  Encounter Date: 02/02/2015      PT End of Session - 02/02/15 1543    Visit Number 5   Number of Visits 12   Date for PT Re-Evaluation 03/03/15   PT Start Time 1103   PT Stop Time 1150   PT Time Calculation (min) 47 min   Activity Tolerance Patient tolerated treatment well   Behavior During Therapy [facility] for tasks assessed/performed      Past Medical History  Diagnosis Date  . Chronic bronchitis ([place])   . Medial meniscus tear     right  . Acute medial meniscus tear of right knee     Past Surgical History  Procedure Laterality Date  . Knee arthroscopy with medial menisectomy Right 01/11/2015    Procedure: RIGHT KNEE ARTHROSCOPY WITH PARTIAL MEDIAL MENISECTOMY AND PARTIAL  LATERAL MENISECTOMY;  Surgeon: Elsie Saas, MD;  Location: [place];  Service: Orthopedics;  Laterality: Right;  . Knee arthroscopy with lateral menisectomy Right 01/11/2015    Procedure: KNEE ARTHROSCOPY WITH LATERAL MENISECTOMY;  Surgeon: Elsie Saas, MD;  Location: [place];  Service: Orthopedics;  Laterality: Right;    There were no vitals filed for this visit.  Visit Diagnosis:  Right knee pain  Right leg weakness  Local edema  Decreased ROM of right knee  Abnormality of gait      Subjective Assessment - 02/02/15 1326    Subjective It feels better when I exercise.  Both knees are sore                         [facility] Adult PT Treatment/Exercise - 02/02/15 1123    High Level Balance   High Level Balance Comments contact guard single leg stand with eyes closed imtermittantly.  Tandem with head movements.     Knee/Hip Exercises: Aerobic   Nustep Nustep  6 minutes,   Knee/Hip Exercises: Supine   Quad Sets 10 reps  2 different positions   Short Arc Quad Sets 10 reps;2 sets  both. 0 LBS   Heel Slides 10 reps   Straight Leg Raises Limitations 10 X 2 sets 10 0, 2.5 LBS   Manual Therapy   Manual therapy comments retrograde thigh to decrease edema and improve patellar tracking.                  PT Short Term Goals - 01/31/15 1632    PT SHORT TERM GOAL #1   Title pt will be I with initial HEP ( 02/10/2015)   Baseline minor cues   Time 3   Period Weeks   Status Achieved   PT SHORT TERM GOAL #2   Title pt will be able to verbalize and demonstrate techniques to reduce R knee inflammation and edema via RICE (02/10/2015)   Time 3   Period Weeks   Status Achieved           PT Long Term Goals - 01/31/15 1633    PT LONG TERM GOAL #1   Title pt will be I with all HEP give as of last visit (03/03/2015)   Time 6   Period Weeks   Status On-going   PT LONG TERM GOAL #2  Title pt will increase her R knee flexion to >/= 120 degrees and extension to 0 with < 2/10 pain  to assist with efficient and functional gait pattern (03/03/2015)   Time 6   Period Weeks   Status On-going   PT LONG TERM GOAL #3   Title pt will increase R knee strenght to >/=4+/5 to assist with lifting/pushing and pulling associated with work related tasks (03/03/2015)   Time 6   Period Weeks   Status On-going   PT LONG TERM GOAL #4   Title pt will increase her FOTO score to >/= 50 to demonstrate improved function at discharge ( 03/03/2015)   Time 6   Period Weeks   Status On-going   PT LONG TERM GOAL #5   Title pt will be able to kneel and get up/ down from the ground independently without assistance with < 2/10 pain for personal goal of gardening again (03/03/2015)   Time 6   Period Weeks   Status On-going               Plan - 02/02/15 1544    Clinical Impression Statement aaacontinue strengthening and ROM as allowed.  Her Extension is O passively.  No  quad lag until after 10 reps with SLR.   PT Next Visit Plan progress exercises to CKC, and balance training exercises. update HEP   Consulted and Agree with Plan of Care Patient        Problem List Patient Active Problem List   Diagnosis Date Noted  . Acute medial meniscus tear of right knee   . Medial meniscus tear   . Chronic bronchitis ([place])     Maryn Freelove 02/02/2015, 3:46 PM  [facility] [address] [place], [place], [address] Phone: [phone]   Fax:  [phone]  Name: Monique Jimenez MRN: [id number] Date of Birth: 1955-09-01    Melvenia Needles, PTA 02/02/2015 3:46 PM Phone: [phone] Fax: [phone]

## 2015-02-07 ENCOUNTER — Ambulatory Visit: Payer: 59 | Admitting: Physical Therapy

## 2015-02-07 DIAGNOSIS — R29898 Other symptoms and signs involving the musculoskeletal system: Secondary | ICD-10-CM

## 2015-02-07 DIAGNOSIS — M25661 Stiffness of right knee, not elsewhere classified: Secondary | ICD-10-CM

## 2015-02-07 DIAGNOSIS — R600 Localized edema: Secondary | ICD-10-CM | POA: Diagnosis not present

## 2015-02-07 DIAGNOSIS — M25561 Pain in right knee: Secondary | ICD-10-CM | POA: Diagnosis not present

## 2015-02-07 DIAGNOSIS — R269 Unspecified abnormalities of gait and mobility: Secondary | ICD-10-CM

## 2015-02-07 NOTE — Therapy (Signed)
[place], [place], [address] Phone: [phone]   Fax:  [phone]  Physical Therapy Treatment  Patient Details  Name: Monique Jimenez MRN: [id number] Date of Birth: 1955/07/04 Referring Provider: Elsie Saas MD  Encounter Date: 02/07/2015      PT End of Session - 02/07/15 1312    Visit Number 6   Number of Visits 12   Date for PT Re-Evaluation 03/03/15   PT Start Time [id number]   PT Stop Time 1230   PT Time Calculation (min) 43 min   Activity Tolerance Patient tolerated treatment well;No increased pain   Behavior During Therapy [facility] for tasks assessed/performed      Past Medical History  Diagnosis Date  . Chronic bronchitis ([place])   . Medial meniscus tear     right  . Acute medial meniscus tear of right knee     Past Surgical History  Procedure Laterality Date  . Knee arthroscopy with medial menisectomy Right 01/11/2015    Procedure: RIGHT KNEE ARTHROSCOPY WITH PARTIAL MEDIAL MENISECTOMY AND PARTIAL  LATERAL MENISECTOMY;  Surgeon: Elsie Saas, MD;  Location: [place];  Service: Orthopedics;  Laterality: Right;  . Knee arthroscopy with lateral menisectomy Right 01/11/2015    Procedure: KNEE ARTHROSCOPY WITH LATERAL MENISECTOMY;  Surgeon: Elsie Saas, MD;  Location: [place];  Service: Orthopedics;  Laterality: Right;    There were no vitals filed for this visit.  Visit Diagnosis:  Right knee pain  Right leg weakness  Local edema  Decreased ROM of right knee  Abnormality of gait      Subjective Assessment - 02/07/15 1156    Subjective No pain.  See's MD Monday.  Has tightness in RT knee,      Currently in Pain? No/denies  baker's cyst sore and mild pain worse on steps, and when she first gets up.    Stiffness improving.     Pain Score 0-No pain   Pain Location Knee   Pain Orientation Right   Pain Descriptors / Indicators Sore  tightness   Pain  Frequency Intermittent                         [facility] Adult PT Treatment/Exercise - 02/07/15 1202    Balance   Balance Assessed --  single leg  stand,  ball toss/catch to trampoline, SBA,    Knee/Hip Exercises: Stretches   Gastroc Stretch 3 reps   Knee/Hip Exercises: Aerobic   Stationary Bike 6 minutes L!   Knee/Hip Exercises: Standing   Heel Raises 2 sets;10 reps  up 2 lower 1   Lateral Step Up 1 set;20 reps;Step Height: 6"   Forward Step Up 1 set;20 reps;Step Height: 6"   Wall Squat 5 reps  tried ball squeeze to make comfortable, + other adjustments   Wall Squat Limitations pinches Baker's cyst, so stopped   SLS 3   SLS with Vectors plyotoss   Other Standing Knee Exercises toe lifts, heel lifts added to home   Knee/Hip Exercises: Supine   Quad Sets 1 set;10 reps  some lateral tracking noted today RT   Short Arc Quad Sets 10 reps;2 sets  2, 5 LBS weight, each, both   Heel Slides 10 reps  118 degrees   Manual Therapy   Manual therapy comments retrograde thigh                PT Education - 02/07/15  1323    Education provided Yes   Education Details ankle strength   Person(s) Educated Patient   Methods Explanation;Demonstration;Verbal cues   Comprehension Verbalized understanding;Returned demonstration          PT Short Term Goals - 02/07/15 1324    PT SHORT TERM GOAL #1   Title pt will be I with initial HEP ( 02/10/2015)   Time 3   Period Weeks   Status Achieved   PT SHORT TERM GOAL #2   Title pt will be able to verbalize and demonstrate techniques to reduce R knee inflammation and edema via RICE (02/10/2015)   Time 3   Period Weeks   Status Achieved           PT Long Term Goals - 02/07/15 1325    PT LONG TERM GOAL #1   Title pt will be I with all HEP give as of last visit (03/03/2015)   Time 6   Period Weeks   Status On-going   PT LONG TERM GOAL #2   Title pt will increase her R knee flexion to >/= 120 degrees and extension to 0  with < 2/10 pain  to assist with efficient and functional gait pattern (03/03/2015)   Baseline 118 today   Time 6   Period Weeks   Status On-going   PT LONG TERM GOAL #3   Time 6   Period Weeks   Status Unable to assess   PT LONG TERM GOAL #4   Title pt will increase her FOTO score to >/= 50 to demonstrate improved function at discharge ( 03/03/2015)   Time 6   Period Weeks   Status Unable to assess   PT LONG TERM GOAL #5   Title pt will be able to kneel and get up/ down from the ground independently without assistance with < 2/10 pain for personal goal of gardening again (03/03/2015)   Time 6   Period Weeks   Status Unable to assess               Plan - 02/07/15 1312    Clinical Impression Statement Patient thinks she will need only 1 more visit.  118 degrees AROM RT flexion.  Less pain with gait.  She reports no more limping, and none observed in clinic.   PT Next Visit Plan review single strengthening.  Give handout, patient forgot to take.  Last Visit?   PT Home Exercise Plan heel lifts., single leg stand.    Consulted and Agree with Plan of Care Patient        Problem List Patient Active Problem List   Diagnosis Date Noted  . Acute medial meniscus tear of right knee   . Medial meniscus tear   . Chronic bronchitis ([place])     Kerem Gilmer 02/07/2015, 1:27 PM  [facility] [address] [place], [place], [address] Phone: [phone]   Fax:  [phone]  Name: Monique Jimenez MRN: [id number] Date of Birth: 01-02-1956    Melvenia Needles, PTA 02/07/2015 1:27 PM Phone: [phone] Fax: [phone]

## 2015-02-07 NOTE — Patient Instructions (Addendum)
   Copyright  VHI. All rights reserved.  Heel Raise (Calf Strength / Balance)    Stand with support,. Rise up on tiptoes, breathing out through pursed lips. Hold position to count of _1__. Return slowly,standing on single leg breathing in. Repeat _1- 20__ times per session. Do__1_ sessions per day. Variation: Do without weights.  Progress to 1 leg.  Also, try balancing on single leg 5 x up to 30 seconds, daily Also try  Standing double toe lifts 10 -20 X daily.  Progress to single leg.  Copyright  VHI. All rights reserved.

## 2015-02-10 ENCOUNTER — Ambulatory Visit: Payer: 59 | Admitting: Physical Therapy

## 2015-02-10 DIAGNOSIS — M25561 Pain in right knee: Secondary | ICD-10-CM

## 2015-02-10 DIAGNOSIS — R29898 Other symptoms and signs involving the musculoskeletal system: Secondary | ICD-10-CM | POA: Diagnosis not present

## 2015-02-10 DIAGNOSIS — R269 Unspecified abnormalities of gait and mobility: Secondary | ICD-10-CM | POA: Diagnosis not present

## 2015-02-10 DIAGNOSIS — M25661 Stiffness of right knee, not elsewhere classified: Secondary | ICD-10-CM

## 2015-02-10 DIAGNOSIS — R600 Localized edema: Secondary | ICD-10-CM | POA: Diagnosis not present

## 2015-02-10 NOTE — Therapy (Signed)
[place], [place], [address] Phone: [phone]   Fax:  [phone]  Physical Therapy Treatment / Discharge Note  Patient Details  Name: Monique Jimenez MRN: [id number] Date of Birth: 1955/11/22 Referring Provider: Elsie Saas MD  Encounter Date: 02/10/2015      PT End of Session - 02/10/15 1148    Visit Number 7   Number of Visits 12   Date for PT Re-Evaluation 03/03/15   PT Start Time 1100   PT Stop Time 1145   PT Time Calculation (min) 45 min   Activity Tolerance Patient tolerated treatment well   Behavior During Therapy [facility] for tasks assessed/performed      Past Medical History  Diagnosis Date  . Chronic bronchitis ([place])   . Medial meniscus tear     right  . Acute medial meniscus tear of right knee     Past Surgical History  Procedure Laterality Date  . Knee arthroscopy with medial menisectomy Right 01/11/2015    Procedure: RIGHT KNEE ARTHROSCOPY WITH PARTIAL MEDIAL MENISECTOMY AND PARTIAL  LATERAL MENISECTOMY;  Surgeon: Elsie Saas, MD;  Location: [place];  Service: Orthopedics;  Laterality: Right;  . Knee arthroscopy with lateral menisectomy Right 01/11/2015    Procedure: KNEE ARTHROSCOPY WITH LATERAL MENISECTOMY;  Surgeon: Elsie Saas, MD;  Location: [place];  Service: Orthopedics;  Laterality: Right;    There were no vitals filed for this visit.  Visit Diagnosis:  Right knee pain  Right leg weakness  Local edema  Decreased ROM of right knee  Abnormality of gait      Subjective Assessment - 02/10/15 1109    Subjective "Haven't had any pain in a while" pt reports the bakers cyst is just irritating especailly after getting up from sitting for a long period of time.    Currently in Pain? No/denies            [facility] PT Assessment - 02/10/15 1120    Observation/Other Assessments   Focus on Therapeutic Outcomes (FOTO)  32% limited   AROM   Right Knee Extension 0   Right Knee Flexion 132   Strength   Right Knee Flexion 4/5   Right Knee Extension 4+/5                             PT Education - 02/10/15 1148    Education provided Yes   Education Details reviewed and updated HEP   Person(s) Educated Patient   Methods Explanation   Comprehension Verbalized understanding          PT Short Term Goals - 02/07/15 1324    PT SHORT TERM GOAL #1   Title pt will be I with initial HEP ( 02/10/2015)   Time 3   Period Weeks   Status Achieved   PT SHORT TERM GOAL #2   Title pt will be able to verbalize and demonstrate techniques to reduce R knee inflammation and edema via RICE (02/10/2015)   Time 3   Period Weeks   Status Achieved           PT Long Term Goals - 02/10/15 1124    PT LONG TERM GOAL #1   Title pt will be I with all HEP give as of last visit (03/03/2015)   Time 6   Period Weeks   Status Achieved   PT LONG TERM GOAL #2   Title  pt will increase her R knee flexion to >/= 120 degrees and extension to 0 with < 2/10 pain  to assist with efficient and functional gait pattern (03/03/2015)   Time 6   Period Weeks   Status Achieved   PT LONG TERM GOAL #3   Title pt will increase R knee strenght to >/=4+/5 to assist with lifting/pushing and pulling associated with work related tasks (03/03/2015)   Baseline knee extension 4+/5, and knee flexion 4/5   Time 6   Period Weeks   Status Partially Met   PT LONG TERM GOAL #4   Title pt will increase her FOTO score to >/= 50 to demonstrate improved function at discharge ( 03/03/2015)   Time 6   Period Weeks   Status Achieved   PT LONG TERM GOAL #5   Title pt will be able to kneel and get up/ down from the ground independently without assistance with < 2/10 pain for personal goal of gardening again (03/03/2015)   Time 6   Period Weeks   Status Achieved               Plan - 02/10/15 1150    Clinical Impression Statement Kendria has made  great progress with therapy. She reports no pain in her knee today. she has improved her R knee AROM and strength to [facility]. she has met all goals except for partially meeting LTG #3. She reports that she is able to maintain and progress her current level of function independently and will be discharged from PT today.    PT Next Visit Plan D/C   PT Home Exercise Plan standing hip abduction/ extension, standing/ seated hamsting stretching, calf stretching, hamstring curls.   Consulted and Agree with Plan of Care Patient        Problem List Patient Active Problem List   Diagnosis Date Noted  . Acute medial meniscus tear of right knee   . Medial meniscus tear   . Chronic bronchitis ([place])    Tona Qualley PT, DPT, LAT, ATC  02/10/2015  11:55 AM    [facility] [address] [place], [place], [address] Phone: [phone]   Fax:  [phone]  Name: SHAYLINN HLADIK MRN: [id number] Date of Birth: 24-Sep-1955     PHYSICAL THERAPY DISCHARGE SUMMARY  Visits from Start of Care: 7  Current functional level related to goals / functional outcomes: FOTO 32% limited   Remaining deficits: Intermittent tightness in the back of the knee and non-painful popping in the knee.    Education / Equipment: HEP, theraband,   Plan: Patient agrees to discharge.  Patient goals were partially met. Patient is being discharged due to being pleased with the current functional level.  ?????        Keegen Heffern PT, DPT, LAT, ATC  02/10/2015  11:58 AM

## 2015-02-10 NOTE — Patient Instructions (Signed)
   Italo Banton PT, DPT, LAT, ATC  [facility] Phone: [phone]     

## 2015-02-13 DIAGNOSIS — S83241D Other tear of medial meniscus, current injury, right knee, subsequent encounter: Secondary | ICD-10-CM | POA: Diagnosis not present

## 2015-02-13 DIAGNOSIS — S83281D Other tear of lateral meniscus, current injury, right knee, subsequent encounter: Secondary | ICD-10-CM | POA: Diagnosis not present

## 2015-02-14 DIAGNOSIS — S83203D Other tear of unspecified meniscus, current injury, right knee, subsequent encounter: Secondary | ICD-10-CM | POA: Diagnosis not present

## 2015-02-14 DIAGNOSIS — M25561 Pain in right knee: Secondary | ICD-10-CM | POA: Diagnosis not present

## 2015-02-14 DIAGNOSIS — M23351 Other meniscus derangements, posterior horn of lateral meniscus, right knee: Secondary | ICD-10-CM | POA: Diagnosis not present

## 2015-02-14 DIAGNOSIS — R531 Weakness: Secondary | ICD-10-CM | POA: Diagnosis not present

## 2015-02-16 DIAGNOSIS — R531 Weakness: Secondary | ICD-10-CM | POA: Diagnosis not present

## 2015-02-16 DIAGNOSIS — M23351 Other meniscus derangements, posterior horn of lateral meniscus, right knee: Secondary | ICD-10-CM | POA: Diagnosis not present

## 2015-02-16 DIAGNOSIS — S83203D Other tear of unspecified meniscus, current injury, right knee, subsequent encounter: Secondary | ICD-10-CM | POA: Diagnosis not present

## 2015-02-16 DIAGNOSIS — M25561 Pain in right knee: Secondary | ICD-10-CM | POA: Diagnosis not present

## 2015-02-20 DIAGNOSIS — S83203D Other tear of unspecified meniscus, current injury, right knee, subsequent encounter: Secondary | ICD-10-CM | POA: Diagnosis not present

## 2015-02-20 DIAGNOSIS — R531 Weakness: Secondary | ICD-10-CM | POA: Diagnosis not present

## 2015-02-20 DIAGNOSIS — M23351 Other meniscus derangements, posterior horn of lateral meniscus, right knee: Secondary | ICD-10-CM | POA: Diagnosis not present

## 2015-02-20 DIAGNOSIS — M25561 Pain in right knee: Secondary | ICD-10-CM | POA: Diagnosis not present

## 2015-02-23 DIAGNOSIS — R350 Frequency of micturition: Secondary | ICD-10-CM | POA: Diagnosis not present

## 2015-02-23 MED FILL — CIPROFLOXACIN HCL 500 MG TA: 500 | 3 days supply | Qty: 6 | Fill #0

## 2015-02-27 DIAGNOSIS — S83203D Other tear of unspecified meniscus, current injury, right knee, subsequent encounter: Secondary | ICD-10-CM | POA: Diagnosis not present

## 2015-02-27 DIAGNOSIS — R531 Weakness: Secondary | ICD-10-CM | POA: Diagnosis not present

## 2015-02-27 DIAGNOSIS — M25561 Pain in right knee: Secondary | ICD-10-CM | POA: Diagnosis not present

## 2015-02-27 DIAGNOSIS — M23351 Other meniscus derangements, posterior horn of lateral meniscus, right knee: Secondary | ICD-10-CM | POA: Diagnosis not present

## 2015-03-02 DIAGNOSIS — M25561 Pain in right knee: Secondary | ICD-10-CM | POA: Diagnosis not present

## 2015-03-02 DIAGNOSIS — R531 Weakness: Secondary | ICD-10-CM | POA: Diagnosis not present

## 2015-03-02 DIAGNOSIS — S83203D Other tear of unspecified meniscus, current injury, right knee, subsequent encounter: Secondary | ICD-10-CM | POA: Diagnosis not present

## 2015-03-02 DIAGNOSIS — M23351 Other meniscus derangements, posterior horn of lateral meniscus, right knee: Secondary | ICD-10-CM | POA: Diagnosis not present

## 2015-03-06 DIAGNOSIS — M23351 Other meniscus derangements, posterior horn of lateral meniscus, right knee: Secondary | ICD-10-CM | POA: Diagnosis not present

## 2015-03-06 MED FILL — MELOXICAM 15 MG TABLET: 15 | 30 days supply | Qty: 30 | Fill #0

## 2015-03-07 DIAGNOSIS — M25561 Pain in right knee: Secondary | ICD-10-CM | POA: Diagnosis not present

## 2015-04-06 MED FILL — MELOXICAM 15 MG TABLET: 15 | 30 days supply | Qty: 30 | Fill #1

## 2015-04-24 ENCOUNTER — Other Ambulatory Visit: Payer: Self-pay

## 2015-04-24 DIAGNOSIS — Z1231 Encounter for screening mammogram for malignant neoplasm of breast: Secondary | ICD-10-CM

## 2015-05-29 ENCOUNTER — Ambulatory Visit
Admission: RE | Admit: 2015-05-29 | Discharge: 2015-05-29 | Disposition: A | Discharge status: Home / Self Care | Payer: 59 | Source: Ambulatory Visit

## 2015-05-29 DIAGNOSIS — Z1231 Encounter for screening mammogram for malignant neoplasm of breast: Secondary | ICD-10-CM

## 2015-06-27 MED FILL — VALACYCLOVIR HCL 500 MG TAB: 500 | 90 days supply | Qty: 45 | Fill #0

## 2015-09-21 DIAGNOSIS — R300 Dysuria: Secondary | ICD-10-CM | POA: Diagnosis not present

## 2015-09-21 MED FILL — CIPROFLOXACIN HCL 500 MG TA: 500 | 3 days supply | Qty: 6 | Fill #0

## 2015-12-11 DIAGNOSIS — M6740 Ganglion, unspecified site: Secondary | ICD-10-CM | POA: Diagnosis not present

## 2015-12-11 DIAGNOSIS — Z0000 Encounter for general adult medical examination without abnormal findings: Secondary | ICD-10-CM | POA: Diagnosis not present

## 2015-12-19 DIAGNOSIS — M67432 Ganglion, left wrist: Secondary | ICD-10-CM | POA: Diagnosis not present

## 2016-04-16 ENCOUNTER — Other Ambulatory Visit: Payer: Self-pay | Admitting: Physician Assistant

## 2016-04-16 DIAGNOSIS — Z1231 Encounter for screening mammogram for malignant neoplasm of breast: Secondary | ICD-10-CM

## 2016-05-07 DIAGNOSIS — H5201 Hypermetropia, right eye: Secondary | ICD-10-CM | POA: Diagnosis not present

## 2016-06-03 ENCOUNTER — Ambulatory Visit
Admission: RE | Admit: 2016-06-03 | Discharge: 2016-06-03 | Disposition: A | Discharge status: Home / Self Care | Payer: 59 | Source: Ambulatory Visit | Attending: Physician Assistant | Admitting: Physician Assistant

## 2016-06-03 ENCOUNTER — Other Ambulatory Visit: Payer: Self-pay | Admitting: Physician Assistant

## 2016-06-03 DIAGNOSIS — Z1231 Encounter for screening mammogram for malignant neoplasm of breast: Secondary | ICD-10-CM | POA: Diagnosis not present

## 2016-09-23 DIAGNOSIS — L308 Other specified dermatitis: Secondary | ICD-10-CM | POA: Diagnosis not present

## 2016-09-23 MED FILL — CLOBETASOL 0.05% OINTMENT: 0.05 | 30 days supply | Qty: 60 | Fill #0

## 2016-12-17 ENCOUNTER — Other Ambulatory Visit: Payer: Self-pay | Admitting: Physician Assistant

## 2016-12-17 DIAGNOSIS — Z13220 Encounter for screening for lipoid disorders: Secondary | ICD-10-CM | POA: Diagnosis not present

## 2016-12-17 DIAGNOSIS — N952 Postmenopausal atrophic vaginitis: Secondary | ICD-10-CM | POA: Diagnosis not present

## 2016-12-17 DIAGNOSIS — Z131 Encounter for screening for diabetes mellitus: Secondary | ICD-10-CM | POA: Diagnosis not present

## 2016-12-17 DIAGNOSIS — Z124 Encounter for screening for malignant neoplasm of cervix: Secondary | ICD-10-CM | POA: Diagnosis not present

## 2016-12-17 DIAGNOSIS — Z0000 Encounter for general adult medical examination without abnormal findings: Secondary | ICD-10-CM | POA: Diagnosis not present

## 2016-12-17 MED FILL — PREMARIN VAGINAL CREAM-APPL: 0.625 | 90 days supply | Qty: 30 | Fill #0

## 2016-12-24 LAB — CYTOLOGY - PAP

## 2017-04-17 MED FILL — CLOBETASOL PROPIONATE 0.05: 0.05 | 30 days supply | Qty: 60 | Fill #1

## 2017-05-06 ENCOUNTER — Other Ambulatory Visit: Payer: Self-pay | Admitting: Physician Assistant

## 2017-05-06 DIAGNOSIS — Z1231 Encounter for screening mammogram for malignant neoplasm of breast: Secondary | ICD-10-CM

## 2017-06-06 ENCOUNTER — Ambulatory Visit
Admission: RE | Admit: 2017-06-06 | Discharge: 2017-06-06 | Disposition: A | Discharge status: Home / Self Care | Payer: 59 | Source: Ambulatory Visit | Attending: Physician Assistant | Admitting: Physician Assistant

## 2017-06-06 DIAGNOSIS — Z1231 Encounter for screening mammogram for malignant neoplasm of breast: Secondary | ICD-10-CM

## 2017-07-21 MED FILL — AMOXICILLIN 500 MG CAPSULE: 500 | 7 days supply | Qty: 21 | Fill #0

## 2017-09-08 ENCOUNTER — Other Ambulatory Visit: Payer: Self-pay | Admitting: Physician Assistant

## 2017-09-08 ENCOUNTER — Ambulatory Visit
Admission: RE | Admit: 2017-09-08 | Discharge: 2017-09-08 | Disposition: A | Discharge status: Home / Self Care | Payer: 59 | Source: Ambulatory Visit | Attending: Physician Assistant | Admitting: Physician Assistant

## 2017-09-08 DIAGNOSIS — R52 Pain, unspecified: Secondary | ICD-10-CM

## 2017-09-08 DIAGNOSIS — M25512 Pain in left shoulder: Secondary | ICD-10-CM | POA: Diagnosis not present

## 2017-09-08 DIAGNOSIS — M19011 Primary osteoarthritis, right shoulder: Secondary | ICD-10-CM | POA: Diagnosis not present

## 2017-09-08 MED FILL — DICLOFENAC SOD EC 50 MG TAB: 50 | 20 days supply | Qty: 40 | Fill #0

## 2017-09-22 MED FILL — CLOBETASOL PROPIONATE 0.05: 0.05 | 30 days supply | Qty: 60 | Fill #2

## 2017-10-01 MED FILL — DICLOFENAC SOD EC 50 MG TAB: 50 | 20 days supply | Qty: 40 | Fill #0

## 2017-12-23 ENCOUNTER — Other Ambulatory Visit (HOSPITAL_COMMUNITY)
Admission: RE | Admit: 2017-12-23 | Discharge: 2017-12-23 | Disposition: A | Discharge status: Home / Self Care | Payer: 59 | Source: Ambulatory Visit | Attending: Physician Assistant | Admitting: Physician Assistant

## 2017-12-23 ENCOUNTER — Other Ambulatory Visit: Payer: Self-pay | Admitting: Physician Assistant

## 2017-12-23 DIAGNOSIS — Z01419 Encounter for gynecological examination (general) (routine) without abnormal findings: Secondary | ICD-10-CM | POA: Diagnosis not present

## 2017-12-23 DIAGNOSIS — M25519 Pain in unspecified shoulder: Secondary | ICD-10-CM | POA: Diagnosis not present

## 2017-12-23 DIAGNOSIS — Z13220 Encounter for screening for lipoid disorders: Secondary | ICD-10-CM | POA: Diagnosis not present

## 2017-12-23 DIAGNOSIS — Z23 Encounter for immunization: Secondary | ICD-10-CM | POA: Diagnosis not present

## 2017-12-23 DIAGNOSIS — Z8249 Family history of ischemic heart disease and other diseases of the circulatory system: Secondary | ICD-10-CM | POA: Diagnosis not present

## 2017-12-23 DIAGNOSIS — Z0000 Encounter for general adult medical examination without abnormal findings: Secondary | ICD-10-CM | POA: Diagnosis not present

## 2017-12-23 DIAGNOSIS — Z124 Encounter for screening for malignant neoplasm of cervix: Secondary | ICD-10-CM | POA: Diagnosis not present

## 2017-12-23 DIAGNOSIS — Z13820 Encounter for screening for osteoporosis: Secondary | ICD-10-CM | POA: Diagnosis not present

## 2017-12-23 LAB — CYTOLOGY - PAP: DIAGNOSIS: NEGATIVE

## 2017-12-23 MED FILL — MELOXICAM 15 MG TABLET: 15 | 30 days supply | Qty: 30 | Fill #0

## 2018-01-19 MED FILL — MELOXICAM 15 MG TABLET: 15 | 30 days supply | Qty: 30 | Fill #0

## 2018-02-23 DIAGNOSIS — Z780 Asymptomatic menopausal state: Secondary | ICD-10-CM | POA: Diagnosis not present

## 2018-02-23 DIAGNOSIS — M8588 Other specified disorders of bone density and structure, other site: Secondary | ICD-10-CM | POA: Diagnosis not present

## 2018-03-06 MED FILL — IBANDRONATE NA 150 MG TAB: 150 | 90 days supply | Qty: 3 | Fill #0 | Status: TO

## 2018-03-06 MED FILL — MELOXICAM 15 MG TABLET: 15 | 30 days supply | Qty: 30 | Fill #0

## 2018-05-04 ENCOUNTER — Other Ambulatory Visit: Payer: Self-pay | Admitting: Physician Assistant

## 2018-05-04 DIAGNOSIS — Z1231 Encounter for screening mammogram for malignant neoplasm of breast: Secondary | ICD-10-CM

## 2018-05-30 MED FILL — IBANDRONATE SODIUM 150 MG T: 150 | 90 days supply | Qty: 3 | Fill #0

## 2018-07-01 ENCOUNTER — Other Ambulatory Visit: Payer: Self-pay

## 2018-07-01 ENCOUNTER — Ambulatory Visit
Admission: RE | Admit: 2018-07-01 | Discharge: 2018-07-01 | Disposition: A | Discharge status: Home / Self Care | Payer: 59 | Source: Ambulatory Visit | Attending: Physician Assistant | Admitting: Physician Assistant

## 2018-07-01 DIAGNOSIS — Z1231 Encounter for screening mammogram for malignant neoplasm of breast: Secondary | ICD-10-CM | POA: Diagnosis not present

## 2018-08-11 MED FILL — MELOXICAM 15 MG TABLET: 15 | 30 days supply | Qty: 30 | Fill #0

## 2018-08-19 DIAGNOSIS — Z0279 Encounter for issue of other medical certificate: Secondary | ICD-10-CM

## 2018-09-04 MED FILL — IBANDRONATE NA 150 MG TAB: 150 | 90 days supply | Qty: 3 | Fill #1

## 2018-09-22 MED FILL — MELOXICAM 15 MG TABLET: 15 | 30 days supply | Qty: 30 | Fill #1

## 2018-10-27 DIAGNOSIS — M545 Low back pain: Secondary | ICD-10-CM | POA: Diagnosis not present

## 2018-10-27 DIAGNOSIS — M25552 Pain in left hip: Secondary | ICD-10-CM | POA: Diagnosis not present

## 2018-10-27 DIAGNOSIS — S83282A Other tear of lateral meniscus, current injury, left knee, initial encounter: Secondary | ICD-10-CM | POA: Diagnosis not present

## 2018-10-27 MED FILL — predniSONE 20 MG TABS: 20 | 12 days supply | Qty: 24 | Fill #0

## 2018-11-16 MED FILL — MELOXICAM 15 MG TABLET: 15 | 30 days supply | Qty: 30 | Fill #0

## 2018-12-03 MED FILL — AMOXICILLIN 500 MG CAPSULE: 500 | 7 days supply | Qty: 21 | Fill #0

## 2018-12-07 MED FILL — IBANDRONATE NA 150 MG TAB: 150 | 90 days supply | Qty: 3 | Fill #2

## 2019-01-12 DIAGNOSIS — Z0000 Encounter for general adult medical examination without abnormal findings: Secondary | ICD-10-CM | POA: Diagnosis not present

## 2019-01-12 DIAGNOSIS — M1990 Unspecified osteoarthritis, unspecified site: Secondary | ICD-10-CM | POA: Diagnosis not present

## 2019-01-12 DIAGNOSIS — F4321 Adjustment disorder with depressed mood: Secondary | ICD-10-CM | POA: Diagnosis not present

## 2019-01-12 DIAGNOSIS — B009 Herpesviral infection, unspecified: Secondary | ICD-10-CM | POA: Diagnosis not present

## 2019-01-12 DIAGNOSIS — M5410 Radiculopathy, site unspecified: Secondary | ICD-10-CM | POA: Diagnosis not present

## 2019-01-12 DIAGNOSIS — M545 Low back pain: Secondary | ICD-10-CM | POA: Diagnosis not present

## 2019-01-12 DIAGNOSIS — M859 Disorder of bone density and structure, unspecified: Secondary | ICD-10-CM | POA: Diagnosis not present

## 2019-01-12 DIAGNOSIS — Z13220 Encounter for screening for lipoid disorders: Secondary | ICD-10-CM | POA: Diagnosis not present

## 2019-01-14 MED FILL — VIT D2 1.25 MG (50,000 UNIT: 1.25 MG | 28 days supply | Qty: 4 | Fill #0

## 2019-02-02 MED FILL — SERTRALINE HCL 50 MG TABLET: 50 | 30 days supply | Qty: 30 | Fill #0

## 2019-02-16 ENCOUNTER — Other Ambulatory Visit: Payer: Self-pay | Admitting: Physician Assistant

## 2019-02-19 MED FILL — VIT D2 1.25 MG (50,000 UNIT: 1.25 MG | 28 days supply | Qty: 4 | Fill #1

## 2019-03-08 MED FILL — IBANDRONATE NA 150 MG TAB: 150 | 90 days supply | Qty: 3 | Fill #0

## 2019-04-05 MED FILL — VIT D2 1.25 MG (50,000 UNIT: 1.25 MG | 28 days supply | Qty: 4 | Fill #2

## 2019-04-27 DIAGNOSIS — M1612 Unilateral primary osteoarthritis, left hip: Secondary | ICD-10-CM | POA: Diagnosis not present

## 2019-04-27 MED FILL — predniSONE 20 MG TABS: 20 | 12 days supply | Qty: 24 | Fill #0

## 2019-05-03 MED FILL — VIT D2 1.25 MG (50,000 UNIT: 1.25 MG | 28 days supply | Qty: 4 | Fill #3

## 2019-05-07 DIAGNOSIS — M545 Low back pain: Secondary | ICD-10-CM | POA: Diagnosis not present

## 2019-05-07 DIAGNOSIS — M25552 Pain in left hip: Secondary | ICD-10-CM | POA: Diagnosis not present

## 2019-05-10 ENCOUNTER — Other Ambulatory Visit: Payer: Self-pay | Admitting: Physician Assistant

## 2019-05-10 DIAGNOSIS — Z1231 Encounter for screening mammogram for malignant neoplasm of breast: Secondary | ICD-10-CM

## 2019-05-20 DIAGNOSIS — M5416 Radiculopathy, lumbar region: Secondary | ICD-10-CM | POA: Diagnosis not present

## 2019-05-20 DIAGNOSIS — M545 Low back pain: Secondary | ICD-10-CM | POA: Diagnosis not present

## 2019-05-20 DIAGNOSIS — M25552 Pain in left hip: Secondary | ICD-10-CM | POA: Diagnosis not present

## 2019-05-26 DIAGNOSIS — M25552 Pain in left hip: Secondary | ICD-10-CM | POA: Diagnosis not present

## 2019-05-31 MED FILL — VIT D2 1.25 MG (50,000 UNIT: 1.25 MG | 28 days supply | Qty: 4 | Fill #4

## 2019-06-07 ENCOUNTER — Other Ambulatory Visit (HOSPITAL_COMMUNITY): Payer: Self-pay | Admitting: Physician Assistant

## 2019-06-29 DIAGNOSIS — M545 Low back pain: Secondary | ICD-10-CM | POA: Diagnosis not present

## 2019-06-29 DIAGNOSIS — M5416 Radiculopathy, lumbar region: Secondary | ICD-10-CM | POA: Diagnosis not present

## 2019-06-29 DIAGNOSIS — M25552 Pain in left hip: Secondary | ICD-10-CM | POA: Diagnosis not present

## 2019-07-05 ENCOUNTER — Other Ambulatory Visit: Payer: Self-pay

## 2019-07-05 ENCOUNTER — Ambulatory Visit
Admission: RE | Admit: 2019-07-05 | Discharge: 2019-07-05 | Disposition: A | Discharge status: Home / Self Care | Payer: 59 | Source: Ambulatory Visit | Attending: Physician Assistant | Admitting: Physician Assistant

## 2019-07-05 DIAGNOSIS — Z1231 Encounter for screening mammogram for malignant neoplasm of breast: Secondary | ICD-10-CM

## 2019-07-16 DIAGNOSIS — E7800 Pure hypercholesterolemia, unspecified: Secondary | ICD-10-CM | POA: Diagnosis not present

## 2019-07-16 DIAGNOSIS — E559 Vitamin D deficiency, unspecified: Secondary | ICD-10-CM | POA: Diagnosis not present

## 2019-07-21 ENCOUNTER — Other Ambulatory Visit (HOSPITAL_COMMUNITY): Payer: Self-pay | Admitting: Physician Assistant

## 2019-07-21 MED FILL — ATORVASTATIN CALCIUM 10 MG: 10 | 30 days supply | Qty: 30 | Fill #0

## 2019-07-27 MED FILL — VIT D2 1.25 MG (50,000 UNIT: 1.25 MG | 28 days supply | Qty: 4 | Fill #0

## 2019-08-20 MED FILL — ATORVASTATIN CALCIUM 10 MG: 10 | 30 days supply | Qty: 30 | Fill #1

## 2019-08-30 MED FILL — VIT D2 1.25 MG (50,000 UNIT: 1.25 MG | 28 days supply | Qty: 4 | Fill #1

## 2019-09-21 MED FILL — VIT D2 1.25 MG (50,000 UNIT: 1.25 MG | 28 days supply | Qty: 4 | Fill #2

## 2019-09-21 MED FILL — ATORVASTATIN CALCIUM 10 MG: 10 | 30 days supply | Qty: 30 | Fill #2

## 2019-10-22 DIAGNOSIS — E7800 Pure hypercholesterolemia, unspecified: Secondary | ICD-10-CM | POA: Diagnosis not present

## 2019-10-25 MED FILL — VIT D2 1.25 MG (50,000 UNIT: 1.25 MG | 28 days supply | Qty: 4 | Fill #3

## 2019-10-25 MED FILL — ATORVASTATIN CALCIUM 10 MG: 10 | 30 days supply | Qty: 30 | Fill #3

## 2019-11-02 MED FILL — IBANDRONATE NA 150 MG TAB: 150 | 84 days supply | Qty: 3 | Fill #1

## 2019-11-03 ENCOUNTER — Other Ambulatory Visit (HOSPITAL_COMMUNITY): Payer: Self-pay | Admitting: Orthopedic Surgery

## 2019-11-03 DIAGNOSIS — M7061 Trochanteric bursitis, right hip: Secondary | ICD-10-CM | POA: Diagnosis not present

## 2019-11-03 MED FILL — predniSONE 10 MG TABS: 10 | 12 days supply | Qty: 48 | Fill #0

## 2019-11-11 DIAGNOSIS — M5416 Radiculopathy, lumbar region: Secondary | ICD-10-CM | POA: Diagnosis not present

## 2019-11-22 MED FILL — VIT D2 1.25 MG (50,000 UNIT: 1.25 MG | 28 days supply | Qty: 4 | Fill #4

## 2019-11-29 ENCOUNTER — Other Ambulatory Visit (HOSPITAL_COMMUNITY): Payer: Self-pay | Admitting: Physician Assistant

## 2019-11-29 MED FILL — ATORVASTATIN CALCIUM 10 MG: 10 | 30 days supply | Qty: 30 | Fill #0

## 2019-12-01 ENCOUNTER — Other Ambulatory Visit (HOSPITAL_COMMUNITY): Payer: Self-pay | Admitting: Physical Medicine and Rehabilitation

## 2019-12-01 MED FILL — NABUMETONE 500 MG TABLET: 500 | 30 days supply | Qty: 60 | Fill #0

## 2019-12-09 ENCOUNTER — Other Ambulatory Visit (HOSPITAL_COMMUNITY): Payer: Self-pay | Admitting: Physician Assistant

## 2019-12-09 MED FILL — VALACYCLOVIR HCL 500 MG TAB: 500 | 90 days supply | Qty: 45 | Fill #0

## 2019-12-20 MED FILL — VIT D2 1.25 MG (50,000 UNIT: 1.25 MG | 28 days supply | Qty: 4 | Fill #5

## 2019-12-30 MED FILL — ATORVASTATIN CALCIUM 10 MG: 10 | 30 days supply | Qty: 30 | Fill #1

## 2020-01-11 DIAGNOSIS — M1990 Unspecified osteoarthritis, unspecified site: Secondary | ICD-10-CM | POA: Diagnosis not present

## 2020-01-11 DIAGNOSIS — B009 Herpesviral infection, unspecified: Secondary | ICD-10-CM | POA: Diagnosis not present

## 2020-01-11 DIAGNOSIS — E7800 Pure hypercholesterolemia, unspecified: Secondary | ICD-10-CM | POA: Diagnosis not present

## 2020-01-11 DIAGNOSIS — E559 Vitamin D deficiency, unspecified: Secondary | ICD-10-CM | POA: Diagnosis not present

## 2020-01-11 DIAGNOSIS — M8580 Other specified disorders of bone density and structure, unspecified site: Secondary | ICD-10-CM | POA: Diagnosis not present

## 2020-01-11 DIAGNOSIS — Z0000 Encounter for general adult medical examination without abnormal findings: Secondary | ICD-10-CM | POA: Diagnosis not present

## 2020-01-11 DIAGNOSIS — Z1211 Encounter for screening for malignant neoplasm of colon: Secondary | ICD-10-CM | POA: Diagnosis not present

## 2020-01-14 ENCOUNTER — Other Ambulatory Visit (HOSPITAL_COMMUNITY): Payer: Self-pay | Admitting: Physician Assistant

## 2020-01-14 MED FILL — VIT D2 1.25 MG (50,000 UNIT: 1.25 MG | 28 days supply | Qty: 4 | Fill #0

## 2020-01-27 MED FILL — ATORVASTATIN CALCIUM 10 MG: 10 | 30 days supply | Qty: 30 | Fill #2

## 2020-02-09 MED FILL — IBANDRONATE NA 150 MG TAB: 150 | 84 days supply | Qty: 3 | Fill #2

## 2020-02-09 MED FILL — VIT D2 1.25 MG (50,000 UNIT: 1.25 MG | 28 days supply | Qty: 4 | Fill #1

## 2020-03-06 MED FILL — ATORVASTATIN CALCIUM 10 MG: 10 | 30 days supply | Qty: 30 | Fill #3

## 2020-03-06 MED FILL — VIT D2 1.25 MG (50,000 UNIT: 1.25 MG | 28 days supply | Qty: 4 | Fill #2

## 2020-03-20 DIAGNOSIS — Z1211 Encounter for screening for malignant neoplasm of colon: Secondary | ICD-10-CM | POA: Diagnosis not present

## 2020-04-05 ENCOUNTER — Other Ambulatory Visit (HOSPITAL_COMMUNITY): Payer: Self-pay | Admitting: Physician Assistant

## 2020-04-05 MED FILL — VIT D2 1.25 MG (50,000 UNIT: 1.25 MG | 28 days supply | Qty: 4 | Fill #3

## 2020-04-05 MED FILL — ATORVASTATIN CALCIUM 10 MG: 10 | 30 days supply | Qty: 30 | Fill #0

## 2020-04-11 DIAGNOSIS — H40023 Open angle with borderline findings, high risk, bilateral: Secondary | ICD-10-CM | POA: Diagnosis not present

## 2020-04-11 DIAGNOSIS — H2513 Age-related nuclear cataract, bilateral: Secondary | ICD-10-CM | POA: Diagnosis not present

## 2020-05-03 ENCOUNTER — Other Ambulatory Visit (HOSPITAL_COMMUNITY): Payer: Self-pay

## 2020-05-03 MED FILL — Ibandronate Sodium Tab 150 MG (Base Equivalent): ORAL | 84 days supply | Qty: 3 | Fill #0 | Status: AC

## 2020-05-09 ENCOUNTER — Other Ambulatory Visit (HOSPITAL_COMMUNITY): Payer: Self-pay

## 2020-05-09 MED FILL — Ergocalciferol Cap 1.25 MG (50000 Unit): ORAL | 28 days supply | Qty: 4 | Fill #0 | Status: AC

## 2020-05-09 MED FILL — Atorvastatin Calcium Tab 10 MG (Base Equivalent): ORAL | 30 days supply | Qty: 30 | Fill #0 | Status: AC

## 2020-05-10 ENCOUNTER — Other Ambulatory Visit (HOSPITAL_COMMUNITY): Payer: Self-pay

## 2020-06-02 ENCOUNTER — Other Ambulatory Visit: Payer: Self-pay | Admitting: Physician Assistant

## 2020-06-02 DIAGNOSIS — Z1231 Encounter for screening mammogram for malignant neoplasm of breast: Secondary | ICD-10-CM

## 2020-06-05 ENCOUNTER — Other Ambulatory Visit (HOSPITAL_COMMUNITY): Payer: Self-pay

## 2020-06-05 MED FILL — Ergocalciferol Cap 1.25 MG (50000 Unit): ORAL | 28 days supply | Qty: 4 | Fill #1 | Status: AC

## 2020-06-15 MED FILL — Atorvastatin Calcium Tab 10 MG (Base Equivalent): ORAL | 30 days supply | Qty: 30 | Fill #1 | Status: AC

## 2020-06-16 ENCOUNTER — Other Ambulatory Visit (HOSPITAL_COMMUNITY): Payer: Self-pay

## 2020-07-03 ENCOUNTER — Other Ambulatory Visit (HOSPITAL_COMMUNITY): Payer: Self-pay

## 2020-07-03 MED ORDER — ERGOCALCIFEROL 1.25 MG (50000 UT) PO CAPS
1.0000 | ORAL_CAPSULE | ORAL | 5 refills | Status: DC
Start: 2020-07-03 — End: 2020-12-25
  Filled 2020-07-03: qty 4, 28d supply, fill #0
  Filled 2020-07-31: qty 4, 28d supply, fill #1
  Filled 2020-09-04: qty 4, 28d supply, fill #2
  Filled 2020-10-01: qty 4, 28d supply, fill #3
  Filled 2020-10-29: qty 4, 28d supply, fill #4
  Filled 2020-11-25: qty 4, 28d supply, fill #5

## 2020-07-28 ENCOUNTER — Ambulatory Visit
Admission: RE | Admit: 2020-07-28 | Discharge: 2020-07-28 | Disposition: A | Discharge status: Home / Self Care | Payer: 59 | Source: Ambulatory Visit | Attending: Physician Assistant | Admitting: Physician Assistant

## 2020-07-28 DIAGNOSIS — Z1231 Encounter for screening mammogram for malignant neoplasm of breast: Secondary | ICD-10-CM | POA: Diagnosis not present

## 2020-07-31 MED FILL — Atorvastatin Calcium Tab 10 MG (Base Equivalent): ORAL | 30 days supply | Qty: 30 | Fill #2 | Status: AC

## 2020-08-01 ENCOUNTER — Other Ambulatory Visit (HOSPITAL_COMMUNITY): Payer: Self-pay

## 2020-09-04 ENCOUNTER — Other Ambulatory Visit (HOSPITAL_COMMUNITY): Payer: Self-pay

## 2020-09-04 MED ORDER — ATORVASTATIN CALCIUM 10 MG PO TABS
10.0000 mg | ORAL_TABLET | Freq: Every day | ORAL | 3 refills | Status: DC
  Filled 2020-09-04: qty 30, 30d supply, fill #0
  Filled 2020-10-15: qty 30, 30d supply, fill #1
  Filled 2020-11-25: qty 30, 30d supply, fill #2
  Filled 2021-01-11: qty 30, 30d supply, fill #3

## 2020-09-04 MED ORDER — IBANDRONATE SODIUM 150 MG PO TABS
ORAL_TABLET | ORAL | 4 refills | Status: DC
  Filled 2020-09-04: qty 3, 90d supply, fill #0
  Filled 2020-12-24: qty 3, 90d supply, fill #1

## 2020-09-25 ENCOUNTER — Other Ambulatory Visit (HOSPITAL_COMMUNITY): Payer: Self-pay

## 2020-09-25 MED FILL — Nabumetone Tab 500 MG: ORAL | 30 days supply | Qty: 60 | Fill #0 | Status: AC

## 2020-10-03 ENCOUNTER — Other Ambulatory Visit (HOSPITAL_COMMUNITY): Payer: Self-pay

## 2020-10-13 ENCOUNTER — Encounter: Payer: Self-pay | Admitting: Orthopedic Surgery

## 2020-10-13 ENCOUNTER — Ambulatory Visit: Payer: 59 | Admitting: Orthopedic Surgery

## 2020-10-13 ENCOUNTER — Other Ambulatory Visit: Payer: Self-pay

## 2020-10-13 ENCOUNTER — Ambulatory Visit (INDEPENDENT_AMBULATORY_CARE_PROVIDER_SITE_OTHER): Payer: 59

## 2020-10-13 VITALS — BP 121/81 | HR 85 | Ht 62.0 in | Wt 167.8 lb

## 2020-10-13 DIAGNOSIS — M25532 Pain in left wrist: Secondary | ICD-10-CM | POA: Diagnosis not present

## 2020-10-13 DIAGNOSIS — M67432 Ganglion, left wrist: Secondary | ICD-10-CM | POA: Diagnosis not present

## 2020-10-13 MED ORDER — LIDOCAINE HCL 1 % IJ SOLN
3.0000 mL | INTRAMUSCULAR | Status: AC | PRN
  Administered 2020-10-13: 3 mL

## 2020-10-13 NOTE — Progress Notes (Signed)
Office Visit Note   Patient: Monique Jimenez           Date of Birth: 1955/09/06           MRN: [id number] Visit Date: 10/13/2020              Requested by: Lennie Odor, PA [address] [place],  [place] [address] PCP: Lennie Odor, PA   Assessment & Plan: Visit Diagnoses:  1. Pain in left wrist   2. Ganglion cyst of dorsum of left wrist     Plan: We discussed treatment options for her ganglion cyst including repeat aspiration verus surgical excision.  Discussed that surgery is the best chance to prevent recurrence.  Given that she had around two years of relief after her last aspiration, she wants to try aspiration again and schedule surgery when it returns.  She is going to call the office when needed.   Follow-Up Instructions: No follow-ups on file.   Orders:  Orders Placed This Encounter  Procedures   XR Wrist Complete Left   No orders of the defined types were placed in this encounter.     Procedures: Hand/UE Inj: L wrist for dorsal carpal ganglion on 10/13/2020 9:09 AM Details: 18 G needle, dorsal approach Medications: 3 mL lidocaine 1 % Aspirate: (amount: 5) (Clear thick cyst fluid) Outcome: tolerated well, no immediate complications Procedure, treatment alternatives, risks and benefits explained, specific risks discussed. Consent was given by the patient. Immediately prior to procedure a time out was called to verify the correct patient, procedure, equipment, support staff and site/side marked as required. Patient was prepped and draped in the usual sterile fashion.      Clinical Data: No additional findings.   Subjective: Chief Complaint  Patient presents with   Left Wrist - New Patient (Initial Visit)    This is a 65 year old right-hand-dominant female who presents with a dorsal radial wrist mass for the last several years.  She had this ganglion cyst drained approximately 3 to 4 years ago with complete resolution however it has since  returned over the last 3 to 4 months and is gotten bigger.  The mass is not painful.  She has full range of motion of her wrist.  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  She denies any numbness or paresthesias.  She is interested in discussing repeat aspiration versus surgical excision today.   Review of Systems  Constitutional: Negative.   Respiratory: Negative.    Cardiovascular: Negative.   Skin: Negative.   Neurological: Negative.     Objective: Vital Signs: BP 121/81 (BP Location: Right Arm, Patient Position: Sitting, Cuff Size: Normal)   Pulse 85   Ht '5\' 2"'$  (1.575 m)   Wt 167 lb 12.8 oz (76.1 kg)   SpO2 98%   BMI 30.69 kg/m   Physical Exam Constitutional:      Appearance: Normal appearance.  Cardiovascular:     Rate and Rhythm: Normal rate.     Pulses: Normal pulses.  Pulmonary:     Effort: Pulmonary effort is normal.  Skin:    General: Skin is warm and dry.     Capillary Refill: Capillary refill takes 2 to 3 seconds.  Neurological:     General: No focal deficit present.     Mental Status: She is alert.    Left Hand Exam   Tenderness  The patient is experiencing no tenderness.  Range of Motion  The patient has normal left wrist ROM.  Muscle Strength  The patient has normal left wrist strength.  Other  Erythema: absent Scars: absent Sensation: normal Pulse: present  Comments:  Large dorsal ganglion cyst.  Cyst is firm, mobile, well circumscribed, no adherent to adjacent tissues.     Specialty Comments:  No specialty comments available.  Imaging: 3V of the left wrist taken today are reviewed and interpreted by me.  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    PMFS History: Patient Active Problem List   Diagnosis Date Noted   Ganglion cyst of dorsum of left wrist 10/13/2020   Acute medial meniscus tear of right knee    Medial  meniscus tear    Chronic bronchitis ([facility])    Past Medical History:  Diagnosis Date   Acute medial meniscus tear of right knee    Chronic bronchitis ([facility])    Medial meniscus tear    right    Family History  Problem Relation Age of Onset   Stroke Mother    Heart failure Father    Breast cancer Maternal Grandmother     Past Surgical History:  Procedure Laterality Date   BREAST EXCISIONAL BIOPSY Left 01/04/1999   benign   KNEE ARTHROSCOPY WITH LATERAL MENISECTOMY Right 01/11/2015   Procedure: KNEE ARTHROSCOPY WITH LATERAL MENISECTOMY;  Surgeon: Elsie Saas, MD;  Location: [place];  Service: Orthopedics;  Laterality: Right;   KNEE ARTHROSCOPY WITH MEDIAL MENISECTOMY Right 01/11/2015   Procedure: RIGHT KNEE ARTHROSCOPY WITH PARTIAL MEDIAL MENISECTOMY AND PARTIAL  LATERAL MENISECTOMY;  Surgeon: Elsie Saas, MD;  Location: [place];  Service: Orthopedics;  Laterality: Right;   Social History   Occupational History   Not on file  Tobacco Use   Smoking status: Never   Smokeless tobacco: Not on file  Substance and Sexual Activity   Alcohol use: No   Drug use: No   Sexual activity: Not on file

## 2020-10-16 ENCOUNTER — Other Ambulatory Visit (HOSPITAL_COMMUNITY): Payer: Self-pay

## 2020-10-30 ENCOUNTER — Other Ambulatory Visit (HOSPITAL_COMMUNITY): Payer: Self-pay

## 2020-11-25 ENCOUNTER — Other Ambulatory Visit (HOSPITAL_COMMUNITY): Payer: Self-pay

## 2020-11-27 ENCOUNTER — Other Ambulatory Visit (HOSPITAL_COMMUNITY): Payer: Self-pay

## 2020-12-24 ENCOUNTER — Other Ambulatory Visit (HOSPITAL_COMMUNITY): Payer: Self-pay

## 2020-12-25 ENCOUNTER — Other Ambulatory Visit (HOSPITAL_COMMUNITY): Payer: Self-pay

## 2020-12-25 MED ORDER — VITAMIN D (ERGOCALCIFEROL) 1.25 MG (50000 UNIT) PO CAPS
50000.0000 [IU] | ORAL_CAPSULE | ORAL | 5 refills | Status: AC
  Filled 2020-12-25: qty 4, 28d supply, fill #0

## 2021-01-01 ENCOUNTER — Other Ambulatory Visit (HOSPITAL_COMMUNITY): Payer: Self-pay

## 2021-01-01 MED ORDER — PEG 3350-KCL-NA BICARB-NACL 420 G PO SOLR
ORAL | 0 refills | Status: AC
  Filled 2021-01-01: qty 4000, 1d supply, fill #0

## 2021-01-09 DIAGNOSIS — K5730 Diverticulosis of large intestine without perforation or abscess without bleeding: Secondary | ICD-10-CM | POA: Diagnosis not present

## 2021-01-09 DIAGNOSIS — D123 Benign neoplasm of transverse colon: Secondary | ICD-10-CM | POA: Diagnosis not present

## 2021-01-09 DIAGNOSIS — Z86010 Personal history of colonic polyps: Secondary | ICD-10-CM | POA: Diagnosis not present

## 2021-01-11 ENCOUNTER — Other Ambulatory Visit (HOSPITAL_COMMUNITY): Payer: Self-pay

## 2021-01-16 DIAGNOSIS — Z124 Encounter for screening for malignant neoplasm of cervix: Secondary | ICD-10-CM | POA: Diagnosis not present

## 2021-01-16 DIAGNOSIS — Z0000 Encounter for general adult medical examination without abnormal findings: Secondary | ICD-10-CM | POA: Diagnosis not present

## 2021-01-16 DIAGNOSIS — E7800 Pure hypercholesterolemia, unspecified: Secondary | ICD-10-CM | POA: Diagnosis not present

## 2021-01-16 DIAGNOSIS — B009 Herpesviral infection, unspecified: Secondary | ICD-10-CM | POA: Diagnosis not present

## 2021-01-16 DIAGNOSIS — M8580 Other specified disorders of bone density and structure, unspecified site: Secondary | ICD-10-CM | POA: Diagnosis not present

## 2021-01-16 DIAGNOSIS — M1990 Unspecified osteoarthritis, unspecified site: Secondary | ICD-10-CM | POA: Diagnosis not present

## 2021-02-05 ENCOUNTER — Other Ambulatory Visit (HOSPITAL_COMMUNITY): Payer: Self-pay

## 2021-02-05 DIAGNOSIS — M5416 Radiculopathy, lumbar region: Secondary | ICD-10-CM | POA: Diagnosis not present

## 2021-02-05 DIAGNOSIS — M25552 Pain in left hip: Secondary | ICD-10-CM | POA: Diagnosis not present

## 2021-02-05 MED ORDER — HYDROCODONE-ACETAMINOPHEN 5-325 MG PO TABS
1.0000 | ORAL_TABLET | Freq: Three times a day (TID) | ORAL | 0 refills | Status: AC | PRN
  Filled 2021-02-05: qty 21, 7d supply, fill #0

## 2021-02-05 MED ORDER — METHYLPREDNISOLONE 4 MG PO TBPK
ORAL_TABLET | ORAL | 0 refills | Status: AC
  Filled 2021-02-05: qty 21, 6d supply, fill #0

## 2021-02-20 ENCOUNTER — Other Ambulatory Visit (HOSPITAL_COMMUNITY): Payer: Self-pay

## 2021-02-20 MED ORDER — MELOXICAM 15 MG PO TABS
15.0000 mg | ORAL_TABLET | Freq: Every day | ORAL | 1 refills | Status: AC
  Filled 2021-02-20: qty 30, 30d supply, fill #0

## 2021-02-20 MED ORDER — ATORVASTATIN CALCIUM 10 MG PO TABS
10.0000 mg | ORAL_TABLET | Freq: Every day | ORAL | 10 refills | Status: DC
  Filled 2021-02-20: qty 30, 30d supply, fill #0
  Filled 2021-04-16: qty 30, 30d supply, fill #1
  Filled 2021-06-03: qty 30, 30d supply, fill #2
  Filled 2021-08-21: qty 30, 30d supply, fill #3
  Filled 2021-10-08: qty 30, 30d supply, fill #4
  Filled 2021-12-24: qty 30, 30d supply, fill #5

## 2021-02-23 ENCOUNTER — Other Ambulatory Visit (HOSPITAL_COMMUNITY): Payer: Self-pay

## 2021-02-23 DIAGNOSIS — M5416 Radiculopathy, lumbar region: Secondary | ICD-10-CM | POA: Diagnosis not present

## 2021-02-23 MED ORDER — HYDROCODONE-ACETAMINOPHEN 5-325 MG PO TABS
1.0000 | ORAL_TABLET | Freq: Three times a day (TID) | ORAL | 0 refills | Status: AC
  Filled 2021-02-23: qty 15, 5d supply, fill #0

## 2021-03-05 DIAGNOSIS — R42 Dizziness and giddiness: Secondary | ICD-10-CM | POA: Diagnosis not present

## 2021-04-17 ENCOUNTER — Other Ambulatory Visit (HOSPITAL_COMMUNITY): Payer: Self-pay

## 2021-04-24 DIAGNOSIS — H2513 Age-related nuclear cataract, bilateral: Secondary | ICD-10-CM | POA: Diagnosis not present

## 2021-04-24 DIAGNOSIS — H40023 Open angle with borderline findings, high risk, bilateral: Secondary | ICD-10-CM | POA: Diagnosis not present

## 2021-05-08 IMAGING — MG DIGITAL SCREENING BILAT W/ TOMO W/ CAD
6 of 10 series · 6 of 30 positions shown · non-contrast
Comparison: Previous exam(s).

CLINICAL DATA: Screening.

EXAM:
DIGITAL SCREENING BILATERAL MAMMOGRAM WITH TOMO AND CAD

[R MLO synth-2D (1 of 2)]
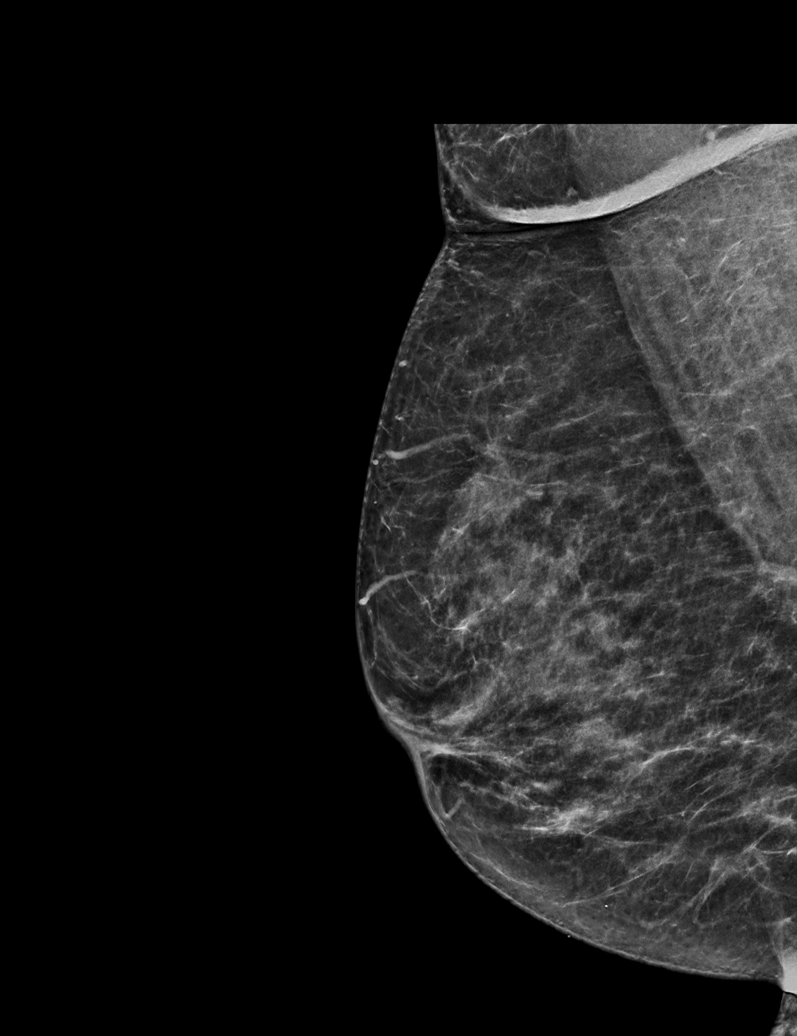

[L MLO synth-2D]
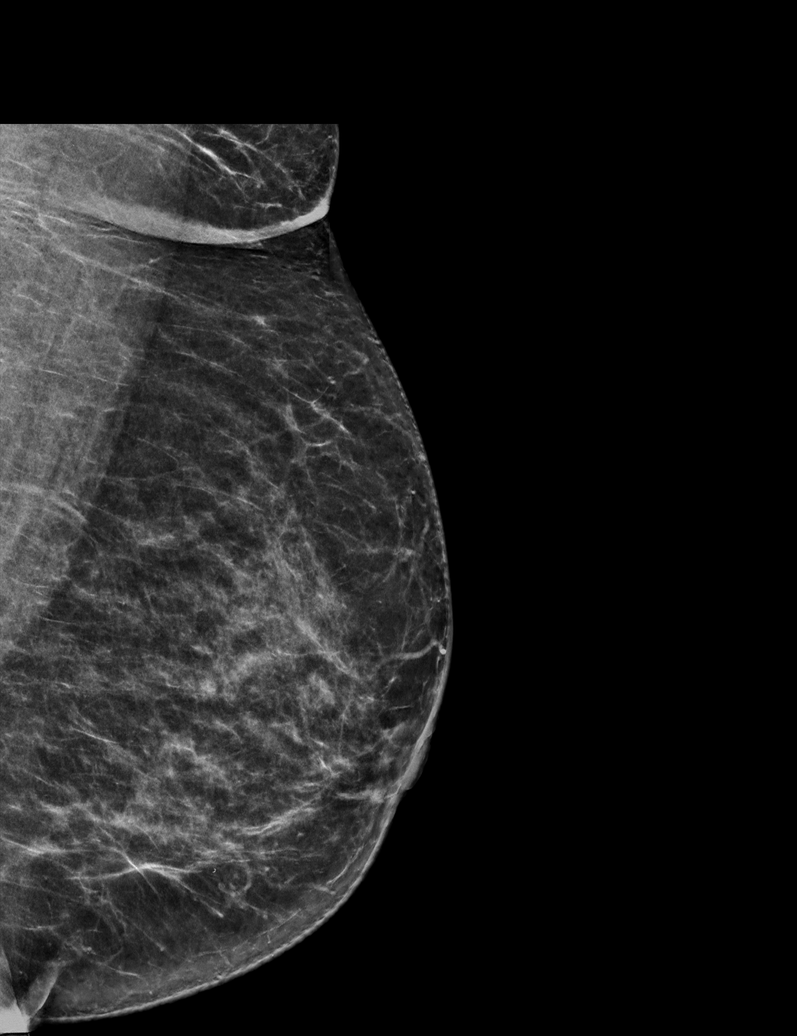

[R MLO synth-2D (2 of 2)]
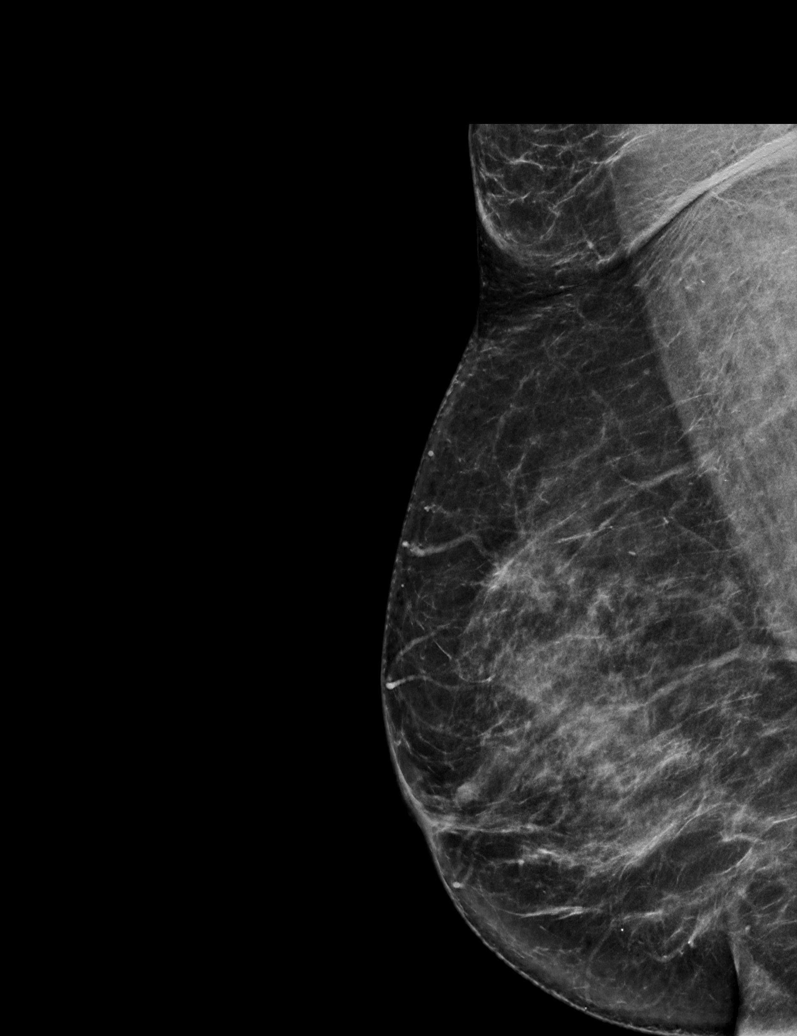

[R CC synth-2D]
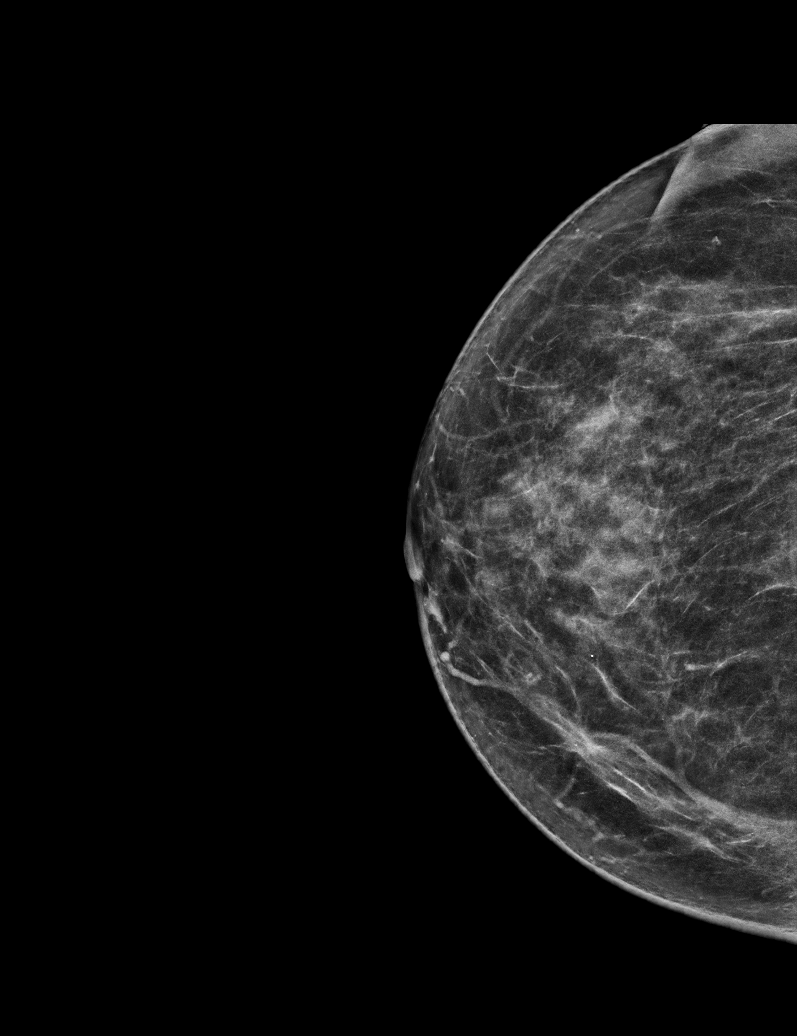

[L CC synth-2D]
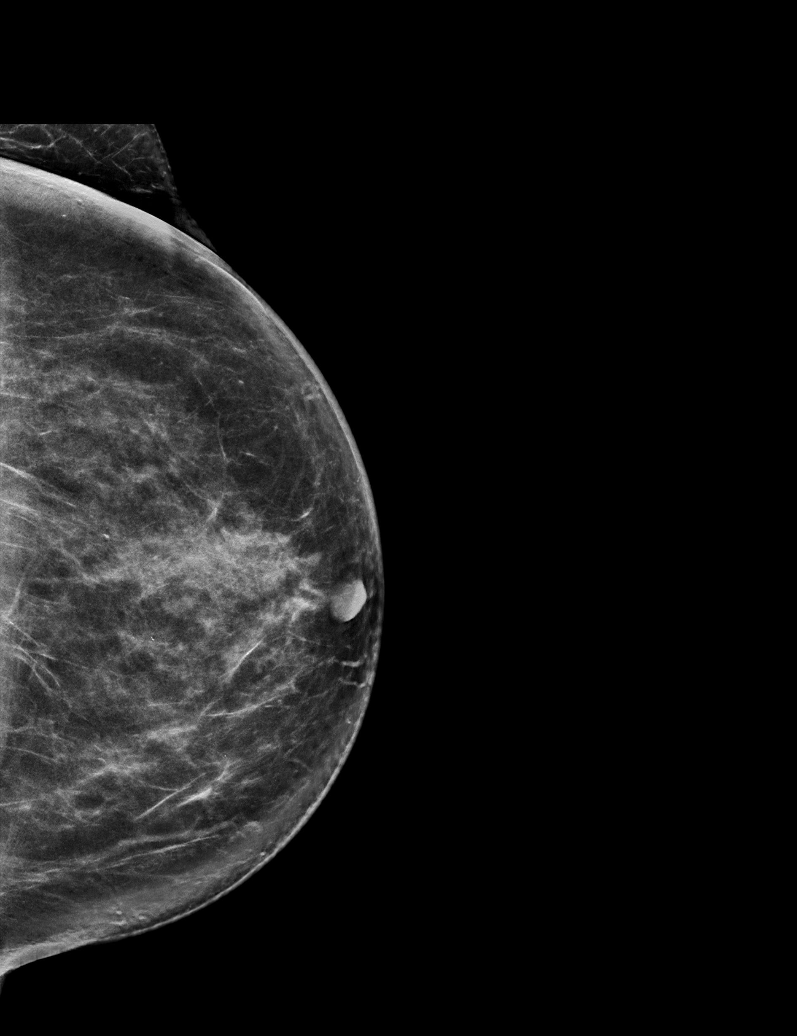

[R MLO tomo · tomo slice 32/63.0]
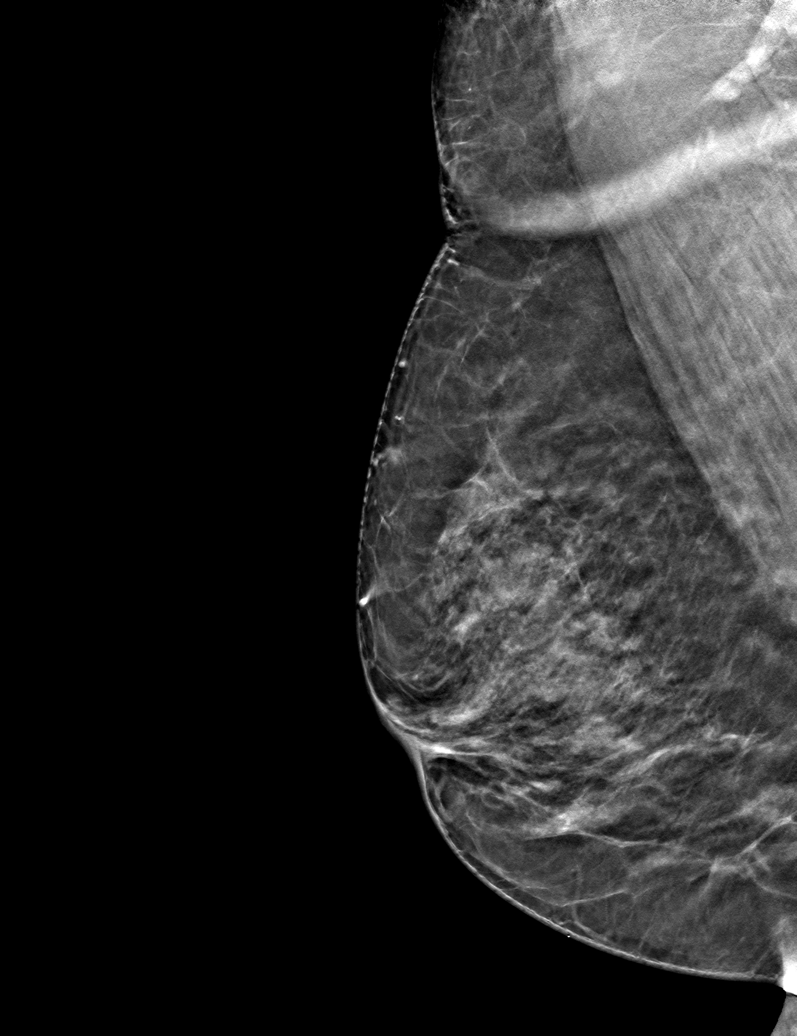

[6 of 30 positions shown; findings below may reference images not displayed]

ACR Breast Density Category c: The breast tissue is heterogeneously
dense, which may obscure small masses.
FINDINGS: There are no findings suspicious for malignancy. Images were
processed with CAD.
IMPRESSION: No mammographic evidence of malignancy. A result letter of this
screening mammogram will be mailed directly to the patient.

RECOMMENDATION:
Screening mammogram in one year. (Code:FT-U-LHB)

BI-RADS CATEGORY  1: Negative.

## 2021-06-04 ENCOUNTER — Other Ambulatory Visit (HOSPITAL_COMMUNITY): Payer: Self-pay

## 2021-06-19 ENCOUNTER — Other Ambulatory Visit: Payer: Self-pay | Admitting: Physician Assistant

## 2021-06-19 DIAGNOSIS — Z1231 Encounter for screening mammogram for malignant neoplasm of breast: Secondary | ICD-10-CM

## 2021-07-12 ENCOUNTER — Ambulatory Visit: Payer: Self-pay

## 2021-07-12 ENCOUNTER — Other Ambulatory Visit: Payer: Self-pay | Admitting: Family Medicine

## 2021-07-12 DIAGNOSIS — M25562 Pain in left knee: Secondary | ICD-10-CM

## 2021-07-23 ENCOUNTER — Other Ambulatory Visit: Payer: Self-pay

## 2021-07-23 ENCOUNTER — Encounter: Payer: Self-pay | Admitting: Physical Therapy

## 2021-07-23 ENCOUNTER — Ambulatory Visit: Payer: PRIVATE HEALTH INSURANCE | Attending: Family Medicine | Admitting: Physical Therapy

## 2021-07-23 DIAGNOSIS — M6281 Muscle weakness (generalized): Secondary | ICD-10-CM | POA: Insufficient documentation

## 2021-07-23 DIAGNOSIS — M25562 Pain in left knee: Secondary | ICD-10-CM | POA: Insufficient documentation

## 2021-07-23 DIAGNOSIS — M79605 Pain in left leg: Secondary | ICD-10-CM | POA: Insufficient documentation

## 2021-07-30 ENCOUNTER — Ambulatory Visit
Admission: RE | Admit: 2021-07-30 | Discharge: 2021-07-30 | Disposition: A | Discharge status: Home / Self Care | Payer: 59 | Source: Ambulatory Visit | Attending: Physician Assistant | Admitting: Physician Assistant

## 2021-07-30 DIAGNOSIS — Z1231 Encounter for screening mammogram for malignant neoplasm of breast: Secondary | ICD-10-CM | POA: Diagnosis not present

## 2021-08-06 ENCOUNTER — Encounter: Payer: Self-pay | Admitting: Physical Therapy

## 2021-08-06 ENCOUNTER — Ambulatory Visit: Payer: PRIVATE HEALTH INSURANCE | Attending: Physician Assistant | Admitting: Physical Therapy

## 2021-08-06 DIAGNOSIS — M6281 Muscle weakness (generalized): Secondary | ICD-10-CM | POA: Insufficient documentation

## 2021-08-06 DIAGNOSIS — M25562 Pain in left knee: Secondary | ICD-10-CM | POA: Insufficient documentation

## 2021-08-06 DIAGNOSIS — M79605 Pain in left leg: Secondary | ICD-10-CM | POA: Insufficient documentation

## 2021-08-06 NOTE — Therapy (Signed)
OUTPATIENT PHYSICAL THERAPY TREATMENT NOTE   Patient Name: Monique Jimenez MRN: [id number] DOB:09-15-1955, 66 y.o., female Today's Date: 08/06/2021  PCP: Lennie Odor, PA   REFERRING PROVIDER: Odis Luster, MD  END OF SESSION:   PT End of Session - 08/06/21 0721     Visit Number 2    Number of Visits 6    Date for PT Re-Evaluation 09/03/21    Authorization Type Worker's Comp    Authorization - Visit Number 2    Authorization - Number of Visits 6    PT Start Time 0715    PT Stop Time 4098    PT Time Calculation (min) 40 min             Past Medical History:  Diagnosis Date   Acute medial meniscus tear of right knee    Chronic bronchitis (HCC)    Medial meniscus tear    right   Past Surgical History:  Procedure Laterality Date   BREAST EXCISIONAL BIOPSY Left 01/04/1999   benign   KNEE ARTHROSCOPY WITH LATERAL MENISECTOMY Right 01/11/2015   Procedure: KNEE ARTHROSCOPY WITH LATERAL MENISECTOMY;  Surgeon: Elsie Saas, MD;  Location: [place];  Service: Orthopedics;  Laterality: Right;   KNEE ARTHROSCOPY WITH MEDIAL MENISECTOMY Right 01/11/2015   Procedure: RIGHT KNEE ARTHROSCOPY WITH PARTIAL MEDIAL MENISECTOMY AND PARTIAL  LATERAL MENISECTOMY;  Surgeon: Elsie Saas, MD;  Location: [place];  Service: Orthopedics;  Laterality: Right;   Patient Active Problem List   Diagnosis Date Noted   Ganglion cyst of dorsum of left wrist 10/13/2020   Acute medial meniscus tear of right knee    Medial meniscus tear    Chronic bronchitis (HCC)     REFERRING DIAG: REFERRING DIAG:  Contusion of left shoulder  Contusion of left lower leg Strain of other specified muscles, fascia and tendons at wrist and hand level, left hand  THERAPY DIAG:  Pain in left leg  Acute pain of left knee  Muscle weakness (generalized)  Rationale for Evaluation and Treatment Rehabilitation  PERTINENT HISTORY: None  PRECAUTIONS: None  SUBJECTIVE: No  pain this morning. I am just stiff in the mornings . I have been dong the exercises everyday.   PAIN:  Are you having pain? NO  NPRS scale: 0/10 (4/10 when up and walking) Pain location: Left buttock and posterior thigh, left knee Pain description: Pressure and stabbing sciatica pain, full knee pain Aggravating factors: Movement after sitting extended periods Relieving factors: Stretches, heat/cold modalities  OBJECTIVE: (objective measures completed at initial evaluation unless otherwise dated)  PATIENT SURVEYS:  FOTO 69% functional status   COGNITION: Overall cognitive status: Within functional limits for tasks assessed                          SENSATION: WFL   MUSCLE LENGTH: Slightly limited left hamstring and piriformis   POSTURE:            Grossly WFL   PALPATION: Tender with palpation of left gluteal and piriformis region   LOWER EXTREMITY ROM:   Active ROM Right eval Left eval  Knee flexion 128 125  Knee extension 0 0    LOWER EXTREMITY MMT:   MMT Right eval Left eval  Hip flexion 4 4  Hip extension 4- 4-  Hip abduction 4- 3  Knee flexion 5 4  Knee extension 5 4+    GAIT: Assistive device utilized: None Level of assistance: Complete  Independence Comments: Slightly antalgic on left     TODAY'S TREATMENT: [facility] Adult PT Treatment:                                                DATE: 08/06/21 Therapeutic Exercise: Nustep L5 UE/LE x 5 minutes  STS x 10 -cues for eccentric control  Long sitting hamstring stretch 3 x 30 sec Bridge 10 x 3 SLR 10 x 3 each Side clam red band 15 x 2 each Figure 4 push and pull 3 x 30 sec each  LTR  10 sec x 10   INITIAL TREATMENT: Longsitting hamstring stretch 2 x 20 sec Piriformis stretch 2 x 20 sec SLR x 10 each Bridge x 10 Clamshell with yellow x 10 each     PATIENT EDUCATION:  Education details: Exam findings, POC, HEP Person educated: Patient Education method: Explanation, Demonstration, Tactile cues,  Verbal cues, and Handouts Education comprehension: verbalized understanding, returned demonstration, verbal cues required, tactile cues required, and needs further education   HOME EXERCISE PROGRAM: Access Code: [email or web address] URL: [email or web address] Date: 08/06/2021 Prepared by: Hessie Diener  Exercises - Seated Table Hamstring Stretch  - 1-2 x daily - 3 reps - 20 seconds hold - Supine Piriformis Stretch with Foot on Ground  - 1-2 x daily - 3 reps - 20 seconds hold - Active Straight Leg Raise with Quad Set  - 1 x daily - 3 sets - 10 reps - Bridge  - 1 x daily - 3 sets - 10 reps - Clam with Resistance  - 1 x daily - 3 sets - 10 reps - Sit to stand with control  - 1 x daily - 7 x weekly - 2 sets - 10 reps     ASSESSMENT: CLINICAL IMPRESSION: Patient is a 66 y.o. female who was seen today for physical therapy treatment primarily for left sided leg and knee pain following a fall at work where she landed on her left side. She has no pain on arrival only morning stiffness which is located in lower back. Worked on flexibility and strength deficits of LE per PT POC. Reviewed HEP and she reports compliance and demonstrates independence. Began STS with cues needed for controled descent.   She reports exercises have been helpful and she feels some improvement .    OBJECTIVE IMPAIRMENTS Abnormal gait, decreased activity tolerance, decreased strength, impaired flexibility, and pain.    ACTIVITY LIMITATIONS lifting, sitting, standing, and locomotion level   PARTICIPATION LIMITATIONS: community activity and occupation   [place] and Past/current experiences are also affecting patient's functional outcome.    REHAB POTENTIAL: Good   CLINICAL DECISION MAKING: Stable/uncomplicated   EVALUATION COMPLEXITY: Low     GOALS: Goals reviewed with patient? Yes   SHORT TERM GOALS: Target date: 08/13/2021    Patient will be I with initial HEP in order to progress with  therapy. Baseline: HEP provided at eval Status: 08/06/21: independent Goal status: MET   2.  PT will review FOTO with patient by 3rd visit in order to understand expected progress and outcome with therapy. Baseline: FOTO assessed at eval Status: reviewed 08/06/21-2nd visit Goal status: MET   3.  Patient will report pain with activity or work related tasks </= 2/10 in order to reduce functional limitations Baseline: 4/10 Goal status: ONGOING   LONG TERM GOALS: Target date: 09/03/2021  Patient will be I with final HEP to maintain progress from PT. Baseline: HEP provided at eval Goal status: INITIAL   2.  Patient will report >/= 77% status on FOTO to indicate improved functional ability. Baseline: 69% functional status Goal status: INITIAL   3.  Patient will demonstrate left knee strength 5/5 MMT and left hip strength >/= 4/5 MMT in order to improve walking tolerance at work. Baseline: patient exhibits strength deficit of left knee and hip Goal status: INITIAL   4.  Patient will report no increase in pain or limitation with work related activities so she can return to prior level of function. Baseline: Patient reports increased pain with work related tasks such as walking Goal status: INITIAL     PLAN: PT FREQUENCY: 1x/week   PT DURATION: 6 weeks   PLANNED INTERVENTIONS: Therapeutic exercises, Therapeutic activity, Neuromuscular re-education, Balance training, Gait training, Patient/Family education, Joint manipulation, Joint mobilization, Aquatic Therapy, Dry Needling, Electrical stimulation, Spinal manipulation, Spinal mobilization, Cryotherapy, Moist heat, Taping, Ionotophoresis 26m/ml Dexamethasone, Manual therapy, and Re-evaluation   PLAN FOR NEXT SESSION: Review HEP and progress PRN, stretching for left hip and hamstring, progressive strengthening for left hip and knee, progressing to closed chain and standing exercises (progress STS) , balance training PRN   JHessie Diener PTA 08/06/21 8:06 AM Phone: [phone]: [phone]

## 2021-08-10 DIAGNOSIS — L308 Other specified dermatitis: Secondary | ICD-10-CM | POA: Diagnosis not present

## 2021-08-12 DIAGNOSIS — M79605 Pain in left leg: Secondary | ICD-10-CM | POA: Diagnosis present

## 2021-08-12 DIAGNOSIS — M25562 Pain in left knee: Secondary | ICD-10-CM | POA: Diagnosis present

## 2021-08-12 DIAGNOSIS — M6281 Muscle weakness (generalized): Secondary | ICD-10-CM | POA: Diagnosis present

## 2021-08-13 ENCOUNTER — Encounter: Payer: Self-pay | Admitting: Physical Therapy

## 2021-08-13 ENCOUNTER — Other Ambulatory Visit: Payer: Self-pay

## 2021-08-13 ENCOUNTER — Ambulatory Visit: Payer: PRIVATE HEALTH INSURANCE | Admitting: Physical Therapy

## 2021-08-13 DIAGNOSIS — M79605 Pain in left leg: Secondary | ICD-10-CM

## 2021-08-13 DIAGNOSIS — M6281 Muscle weakness (generalized): Secondary | ICD-10-CM

## 2021-08-13 DIAGNOSIS — M25562 Pain in left knee: Secondary | ICD-10-CM

## 2021-08-13 NOTE — Patient Instructions (Signed)
Access Code: [email or web address] URL: [email or web address] Date: 08/13/2021 Prepared by: Hilda Blades  Exercises - Seated Table Hamstring Stretch  - 1-2 x daily - 3 reps - 20 seconds hold - Supine Piriformis Stretch with Foot on Ground  - 1-2 x daily - 3 reps - 20 seconds hold - Active Straight Leg Raise with Quad Set  - 1 x daily - 3 sets - 10 reps - Bridge  - 1 x daily - 3 sets - 10 reps - Clam with Resistance  - 1 x daily - 3 sets - 10 reps - Sit to stand with control  - 1 x daily - 3 sets - 10 reps - Standing Glute Med Mobilization with Small Ball on Wall  - 1 x daily

## 2021-08-13 NOTE — Therapy (Signed)
OUTPATIENT PHYSICAL THERAPY TREATMENT NOTE   Patient Name: Monique Jimenez MRN: [id number] DOB:18-Nov-1955, 66 y.o., female Today's Date: 08/13/2021  PCP: Lennie Odor, PA   REFERRING PROVIDER: Odis Luster, MD   END OF SESSION:   PT End of Session - 08/13/21 0831     Visit Number 3    Number of Visits 6    Date for PT Re-Evaluation 09/03/21    Authorization Type Worker's Comp    Authorization - Visit Number 3    Authorization - Number of Visits 6    PT Start Time 0830    PT Stop Time 0910    PT Time Calculation (min) 40 min    Activity Tolerance Patient tolerated treatment well    Behavior During Therapy WFL for tasks assessed/performed              Past Medical History:  Diagnosis Date   Acute medial meniscus tear of right knee    Chronic bronchitis (HCC)    Medial meniscus tear    right   Past Surgical History:  Procedure Laterality Date   BREAST EXCISIONAL BIOPSY Left 01/04/1999   benign   KNEE ARTHROSCOPY WITH LATERAL MENISECTOMY Right 01/11/2015   Procedure: KNEE ARTHROSCOPY WITH LATERAL MENISECTOMY;  Surgeon: Elsie Saas, MD;  Location: [place];  Service: Orthopedics;  Laterality: Right;   KNEE ARTHROSCOPY WITH MEDIAL MENISECTOMY Right 01/11/2015   Procedure: RIGHT KNEE ARTHROSCOPY WITH PARTIAL MEDIAL MENISECTOMY AND PARTIAL  LATERAL MENISECTOMY;  Surgeon: Elsie Saas, MD;  Location: [place];  Service: Orthopedics;  Laterality: Right;   Patient Active Problem List   Diagnosis Date Noted   Ganglion cyst of dorsum of left wrist 10/13/2020   Acute medial meniscus tear of right knee    Medial meniscus tear    Chronic bronchitis (HCC)     REFERRING DIAG: REFERRING DIAG:  Contusion of left shoulder  Contusion of left lower leg Strain of other specified muscles, fascia and tendons at wrist and hand level, left hand  THERAPY DIAG:  Pain in left leg  Acute pain of left knee  Muscle weakness  (generalized)  Rationale for Evaluation and Treatment Rehabilitation  PERTINENT HISTORY: None  PRECAUTIONS: None   SUBJECTIVE: Patient reports she feels like she is doing better, her knee is feeling better. But her left sided sciatica is still aggravated from the fall and she gets occasional shooting pain.  PAIN:  Are you having pain? No NPRS scale: 0/10 (4/10 when up and walking) Pain location: Left buttock and posterior thigh, left knee Pain description: Pressure and stabbing sciatica pain, full knee pain Aggravating factors: Movement after sitting extended periods Relieving factors: Stretches, heat/cold modalities   OBJECTIVE: (objective measures completed at initial evaluation unless otherwise dated) PATIENT SURVEYS:  FOTO 69% functional status   MUSCLE LENGTH: Slightly limited left hamstring and piriformis   PALPATION: Tender with palpation of left gluteal and piriformis region   LOWER EXTREMITY ROM:   Active ROM Right eval Left eval  Knee flexion 128 125  Knee extension 0 0    LOWER EXTREMITY MMT:   MMT Right eval Left eval Rt / Lt 08/13/2021  Hip flexion 4 4   Hip extension 4- 4-   Hip abduction 4- 3 4- / 3+  Knee flexion 5 4   Knee extension 5 4+     GAIT: Assistive device utilized: None Level of assistance: Complete Independence Comments: Slightly antalgic on left     TODAY'S TREATMENT:  [facility] Adult PT Treatment:                                                DATE: 08/13/21 Therapeutic Exercise: Nustep L6 x 5 min with UE/LE while taking subjective Long sitting hamstring stretch 3 x 20 sec (left) Supine active hamstring stretch 10 x 5 sec (left) Supine sciatic nerve floss ankle DF/PF 10 x 5 sec (left) Piriformis stretch 3 x 20 sec (left) Bridge x 10 Sidelying hip abduction 2 x 10 (left) Figure-4 bridge 2 x 10 (left) Side clamshell with green 2 x 15 (left) 90-90 table top hold 2 x 6 with 10 sec hold Sit to stand x 10, holding 10# 2 x 10 SMFR  using tennis ball to left hip region   [facility] Adult PT Treatment:                                                DATE: 08/06/21 Therapeutic Exercise: Nustep L5 UE/LE x 5 minutes  STS x 10 -cues for eccentric control  Long sitting hamstring stretch 3 x 30 sec Bridge 10 x 3 SLR 10 x 3 each Side clam red band 15 x 2 each Figure 4 push and pull 3 x 30 sec each  LTR  10 sec x 10  [facility] Adult PT Treatment:                                                DATE: 07/23/21 Therapeutic Exercise: Longsitting hamstring stretch 2 x 20 sec Piriformis stretch 2 x 20 sec SLR x 10 each Bridge x 10 Clamshell with yellow x 10 each   PATIENT EDUCATION:  Education details: HEP Person educated: Patient Education method: Consulting civil engineer, Media planner, Corporate treasurer cues, Verbal cues Education comprehension: verbalized understanding, returned demonstration, verbal cues required, tactile cues required, and needs further education   HOME EXERCISE PROGRAM: Access Code: [email or web address]     ASSESSMENT: CLINICAL IMPRESSION: Patient tolerated therapy well with no adverse effects. Therapy focused primarily on flexibility and incorporating sciatic nerve flossing, and progression of hip and core strengthening with good tolerance. She does require cueing for lumbopelvic control with exercises to avoid excessive lumbar lordosis. Patient was provided green band to progress resistance with clamshell at home and incorporated SMFR using tennis ball for the left hip region. Patient would benefit from continued skilled PT to progress her mobility and strength in order to reduce pain and maximize functional ability.    OBJECTIVE IMPAIRMENTS Abnormal gait, decreased activity tolerance, decreased strength, impaired flexibility, and pain.    ACTIVITY LIMITATIONS lifting, sitting, standing, and locomotion level   PARTICIPATION LIMITATIONS: community activity and occupation   [place] and Past/current experiences are also  affecting patient's functional outcome.      GOALS: Goals reviewed with patient? Yes   SHORT TERM GOALS: Target date: 08/13/2021    Patient will be I with initial HEP in order to progress with therapy. Baseline: HEP provided at eval Status: 08/06/21: independent Goal status: MET   2.  PT will review FOTO with patient by 3rd visit in order to  understand expected progress and outcome with therapy. Baseline: FOTO assessed at eval Status: reviewed 08/06/21-2nd visit Goal status: MET   3.  Patient will report pain with activity or work related tasks </= 2/10 in order to reduce functional limitations Baseline: 4/10 Goal status: ONGOING   LONG TERM GOALS: Target date: 09/03/2021    Patient will be I with final HEP to maintain progress from PT. Baseline: HEP provided at eval Goal status: INITIAL   2.  Patient will report >/= 77% status on FOTO to indicate improved functional ability. Baseline: 69% functional status Goal status: INITIAL   3.  Patient will demonstrate left knee strength 5/5 MMT and left hip strength >/= 4/5 MMT in order to improve walking tolerance at work. Baseline: patient exhibits strength deficit of left knee and hip Goal status: INITIAL   4.  Patient will report no increase in pain or limitation with work related activities so she can return to prior level of function. Baseline: Patient reports increased pain with work related tasks such as walking Goal status: INITIAL     PLAN: PT FREQUENCY: 1x/week   PT DURATION: 6 weeks   PLANNED INTERVENTIONS: Therapeutic exercises, Therapeutic activity, Neuromuscular re-education, Balance training, Gait training, Patient/Family education, Joint manipulation, Joint mobilization, Aquatic Therapy, Dry Needling, Electrical stimulation, Spinal manipulation, Spinal mobilization, Cryotherapy, Moist heat, Taping, Ionotophoresis 12m/ml Dexamethasone, Manual therapy, and Re-evaluation   PLAN FOR NEXT SESSION: Review HEP and progress  PRN, stretching for left hip and hamstring, progressive strengthening for left hip and knee, progressing to closed chain and standing exercises (progress STS) , balance training PRN   CHilda Blades PT, DPT, LAT, ATC 08/13/21  9:12 AM Phone: [phone]: [phone]

## 2021-08-20 ENCOUNTER — Ambulatory Visit: Payer: PRIVATE HEALTH INSURANCE | Attending: Family Medicine | Admitting: Physical Therapy

## 2021-08-20 ENCOUNTER — Other Ambulatory Visit: Payer: Self-pay

## 2021-08-20 ENCOUNTER — Encounter: Payer: Self-pay | Admitting: Physical Therapy

## 2021-08-20 DIAGNOSIS — M25562 Pain in left knee: Secondary | ICD-10-CM | POA: Diagnosis present

## 2021-08-20 DIAGNOSIS — M6281 Muscle weakness (generalized): Secondary | ICD-10-CM | POA: Insufficient documentation

## 2021-08-20 DIAGNOSIS — M79605 Pain in left leg: Secondary | ICD-10-CM | POA: Insufficient documentation

## 2021-08-20 NOTE — Therapy (Signed)
OUTPATIENT PHYSICAL THERAPY TREATMENT NOTE   Patient Name: Monique Jimenez MRN: [id number] DOB:[birth year], 66 y.o., female Today's Date: 08/20/2021  PCP: Lennie Odor, PA   REFERRING PROVIDER: Odis Luster, MD   END OF SESSION:   PT End of Session - 08/20/21 0837     Visit Number 4    Number of Visits 6    Date for PT Re-Evaluation 09/03/21    Authorization Type Worker's Comp    Authorization - Visit Number 4    Authorization - Number of Visits 6    PT Start Time 0830    PT Stop Time 0910    PT Time Calculation (min) 40 min    Activity Tolerance Patient tolerated treatment well    Behavior During Therapy WFL for tasks assessed/performed               Past Medical History:  Diagnosis Date   Acute medial meniscus tear of right knee    Chronic bronchitis (HCC)    Medial meniscus tear    right   Past Surgical History:  Procedure Laterality Date   BREAST EXCISIONAL BIOPSY Left 01/04/1999   benign   KNEE ARTHROSCOPY WITH LATERAL MENISECTOMY Right 01/11/2015   Procedure: KNEE ARTHROSCOPY WITH LATERAL MENISECTOMY;  Surgeon: Elsie Saas, MD;  Location: [place];  Service: Orthopedics;  Laterality: Right;   KNEE ARTHROSCOPY WITH MEDIAL MENISECTOMY Right 01/11/2015   Procedure: RIGHT KNEE ARTHROSCOPY WITH PARTIAL MEDIAL MENISECTOMY AND PARTIAL  LATERAL MENISECTOMY;  Surgeon: Elsie Saas, MD;  Location: [place];  Service: Orthopedics;  Laterality: Right;   Patient Active Problem List   Diagnosis Date Noted   Ganglion cyst of dorsum of left wrist 10/13/2020   Acute medial meniscus tear of right knee    Medial meniscus tear    Chronic bronchitis (HCC)     REFERRING DIAG: REFERRING DIAG:  Contusion of left shoulder  Contusion of left lower leg Strain of other specified muscles, fascia and tendons at wrist and hand level, left hand  THERAPY DIAG:  Pain in left leg  Acute pain of left knee  Muscle weakness  (generalized)  Rationale for Evaluation and Treatment Rehabilitation  PERTINENT HISTORY: None  PRECAUTIONS: None   SUBJECTIVE: Patient states she feels good this morning, she does still feel stiff when she gets up. She went back to work after about 5 days off and it took her a little while to get back in the swing of things but it went well.  PAIN:  Are you having pain? No NPRS scale: 0/10 Pain location: Left buttock and posterior thigh, left knee Pain description: Pressure and stabbing sciatica pain, full knee pain Aggravating factors: Movement after sitting extended periods Relieving factors: Stretches, heat/cold modalities   OBJECTIVE: (objective measures completed at initial evaluation unless otherwise dated) PATIENT SURVEYS:  FOTO 69% functional status   MUSCLE LENGTH: Slightly limited left hamstring and piriformis   PALPATION: Tender with palpation of left gluteal and piriformis region   LOWER EXTREMITY ROM:   Active ROM Right eval Left eval  Knee flexion 128 125  Knee extension 0 0    LOWER EXTREMITY MMT:   MMT Right eval Left eval Rt / Lt 08/13/2021 Rt / Lt 08/20/2021  Hip flexion 4 4    Hip extension 4- 4-    Hip abduction 4- 3 4- / 3+ 4- / 3+  Knee flexion 5 4    Knee extension 5 4+  GAIT: Assistive device utilized: None Level of assistance: Complete Independence Comments: Slightly antalgic on left     TODAY'S TREATMENT: [facility] Adult PT Treatment:                                                DATE: 08/20/21 Therapeutic Exercise: Nustep L6 x 5 min with UE/LE while taking subjective Long sitting hamstring stretch 3 x 20 sec (left) Supine sciatic nerve floss ankle DF/PF 2 x 10 with 5 sec (left) Piriformis stretch 3 x 20 sec (left) LTR 5 x 5 sec each Figure-4 bridge 2 x 10 each 90-90 table top hold 10 x 10 sec hold 90-90 alternating foot tap x 10 each Sidelying hip abduction 2 x 10 each Sit to stand holding 15# at chest 2 x 10   [facility] Adult  PT Treatment:                                                DATE: 08/13/21 Therapeutic Exercise: Nustep L6 x 5 min with UE/LE while taking subjective Long sitting hamstring stretch 3 x 20 sec (left) Supine active hamstring stretch 10 x 5 sec (left) Supine sciatic nerve floss ankle DF/PF 10 x 5 sec (left) Piriformis stretch 3 x 20 sec (left) Bridge x 10 Sidelying hip abduction 2 x 10 (left) Figure-4 bridge 2 x 10 (left) Side clamshell with green 2 x 15 (left) 90-90 table top hold 2 x 6 with 10 sec hold Sit to stand x 10, holding 10# 2 x 10 SMFR using tennis ball to left hip region  [facility] Adult PT Treatment:                                                DATE: 08/06/21 Therapeutic Exercise: Nustep L5 UE/LE x 5 minutes  STS x 10 -cues for eccentric control  Long sitting hamstring stretch 3 x 30 sec Bridge 10 x 3 SLR 10 x 3 each Side clam red band 15 x 2 each Figure 4 push and pull 3 x 30 sec each  LTR  10 sec x 10   PATIENT EDUCATION:  Education details: HEP update Person educated: Patient Education method: Explanation, Demonstration, Tactile cues, Verbal cues, Handout Education comprehension: verbalized understanding, returned demonstration, verbal cues required, tactile cues required, and needs further education   HOME EXERCISE PROGRAM: Access Code: [email or web address]     ASSESSMENT: CLINICAL IMPRESSION: Patient tolerated therapy well with no adverse effects. Therapy continues to progress hip and core strengthening, and patient is tolerating progressions without report of increased pain. She does continues to exhibit gross hip strength deficit. Updated HEP to progress strengthening at home. Patient would benefit from continued skilled PT to progress her mobility and strength in order to reduce pain and maximize functional ability.    OBJECTIVE IMPAIRMENTS Abnormal gait, decreased activity tolerance, decreased strength, impaired flexibility, and pain.    ACTIVITY LIMITATIONS lifting,  sitting, standing, and locomotion level   PARTICIPATION LIMITATIONS: community activity and occupation   PERSONAL FACTORS Fitness and Past/current experiences are also affecting patient's functional outcome.        GOALS: Goals reviewed with patient? Yes   SHORT TERM GOALS: Target date: 08/13/2021    Patient will be I with initial HEP in order to progress with therapy. Baseline: HEP provided at eval Status: 08/06/21: independent Goal status: MET   2.  PT will review FOTO with patient by 3rd visit in order to understand expected progress and outcome with therapy. Baseline: FOTO assessed at eval Status: reviewed 08/06/21-2nd visit Goal status: MET   3.  Patient will report pain with activity or work related tasks </= 2/10 in order to reduce functional limitations Baseline: 4/10 Goal status: ONGOING   LONG TERM GOALS: Target date: 09/03/2021    Patient will be I with final HEP to maintain progress from PT. Baseline: HEP provided at eval Goal status: INITIAL   2.  Patient will report >/= 77% status on FOTO to indicate improved functional ability. Baseline: 69% functional status Goal status: INITIAL   3.  Patient will demonstrate left knee strength 5/5 MMT and left hip strength >/= 4/5 MMT in order to improve walking tolerance at work. Baseline: patient exhibits strength deficit of left knee and hip Goal status: INITIAL   4.  Patient will report no increase in pain or limitation with work related activities so she can return to prior level of function. Baseline: Patient reports increased pain with work related tasks such as walking Goal status: INITIAL     PLAN: PT FREQUENCY: 1x/week   PT DURATION: 6 weeks   PLANNED INTERVENTIONS: Therapeutic exercises, Therapeutic activity, Neuromuscular re-education, Balance training, Gait training, Patient/Family education, Joint manipulation, Joint mobilization, Aquatic Therapy, Dry Needling, Electrical stimulation, Spinal manipulation,  Spinal mobilization, Cryotherapy, Moist heat, Taping, Ionotophoresis 37m/ml Dexamethasone, Manual therapy, and Re-evaluation   PLAN FOR NEXT SESSION: Review HEP and progress PRN, stretching for left hip and hamstring, progressive strengthening for left hip and knee, progressing to closed chain and standing exercises (progress STS) , balance training PRN   CHilda Blades PT, DPT, LAT, ATC 08/20/21  9:13 AM Phone: [phone]: [phone]

## 2021-08-20 NOTE — Patient Instructions (Signed)
Access Code: [email or web address] URL: [email or web address] Date: 08/20/2021 Prepared by: Hilda Blades  Exercises - Seated Table Hamstring Stretch  - 1-2 x daily - 3 reps - 20 seconds hold - Supine Piriformis Stretch with Foot on Ground  - 1-2 x daily - 3 reps - 20 seconds hold - Active Straight Leg Raise with Quad Set  - 1 x daily - 3 sets - 10 reps - Figure 4 Bridge  - 1 x daily - 3 sets - 10 reps - Clam with Resistance  - 1 x daily - 3 sets - 10 reps - Sit to stand with control  - 1 x daily - 3 sets - 10 reps - Standing Glute Med Mobilization with Small Ball on Wall  - 1 x daily - Supine Sciatic Nerve Glide  - 1 x daily - 2 sets - 10 reps - 5 seconds hold

## 2021-08-21 ENCOUNTER — Other Ambulatory Visit (HOSPITAL_COMMUNITY): Payer: Self-pay

## 2021-08-27 ENCOUNTER — Other Ambulatory Visit: Payer: Self-pay

## 2021-08-27 ENCOUNTER — Encounter: Payer: Self-pay | Admitting: Physical Therapy

## 2021-08-27 ENCOUNTER — Ambulatory Visit: Payer: PRIVATE HEALTH INSURANCE | Attending: Family Medicine | Admitting: Physical Therapy

## 2021-08-27 DIAGNOSIS — M6281 Muscle weakness (generalized): Secondary | ICD-10-CM | POA: Diagnosis present

## 2021-08-27 DIAGNOSIS — M25562 Pain in left knee: Secondary | ICD-10-CM | POA: Diagnosis present

## 2021-08-27 DIAGNOSIS — M79605 Pain in left leg: Secondary | ICD-10-CM | POA: Diagnosis not present

## 2021-08-27 NOTE — Patient Instructions (Signed)
Access Code: [email or web address] URL: [email or web address] Date: 08/27/2021 Prepared by: Hilda Blades  Exercises - Seated Table Hamstring Stretch  - 1-2 x daily - 3 reps - 20 seconds hold - Supine Piriformis Stretch with Foot on Ground  - 1-2 x daily - 3 reps - 20 seconds hold - Supine 90/90 Alternating Heel Touches with Posterior Pelvic Tilt  - 1 x daily - 3 sets - 10 reps - Figure 4 Bridge  - 1 x daily - 3 sets - 10 reps - Sidelying Hip Abduction  - 1 x daily - 3 sets - 10 reps - Sit to stand with control  - 1 x daily - 3 sets - 10 reps - Standing Glute Med Mobilization with Small Ball on Wall  - 1 x daily - Supine Sciatic Nerve Glide  - 1 x daily - 2 sets - 10 reps - 5 seconds hold

## 2021-08-27 NOTE — Therapy (Signed)
OUTPATIENT PHYSICAL THERAPY TREATMENT NOTE   Patient Name: Monique Jimenez MRN: [id number] DOB:1955/04/06, 66 y.o., female Today's Date: 08/27/2021  PCP: Lennie Odor, PA   REFERRING PROVIDER: Odis Luster, MD   END OF SESSION:   PT End of Session - 08/27/21 0836     Visit Number 5    Number of Visits 6    Date for PT Re-Evaluation 09/03/21    Authorization Type Worker's Comp    Authorization - Visit Number 5    Authorization - Number of Visits 6    PT Start Time 0830    PT Stop Time 0910    PT Time Calculation (min) 40 min    Activity Tolerance Patient tolerated treatment well    Behavior During Therapy WFL for tasks assessed/performed                Past Medical History:  Diagnosis Date   Acute medial meniscus tear of right knee    Chronic bronchitis (HCC)    Medial meniscus tear    right   Past Surgical History:  Procedure Laterality Date   BREAST EXCISIONAL BIOPSY Left 01/04/1999   benign   KNEE ARTHROSCOPY WITH LATERAL MENISECTOMY Right 01/11/2015   Procedure: KNEE ARTHROSCOPY WITH LATERAL MENISECTOMY;  Surgeon: Elsie Saas, MD;  Location: [place];  Service: Orthopedics;  Laterality: Right;   KNEE ARTHROSCOPY WITH MEDIAL MENISECTOMY Right 01/11/2015   Procedure: RIGHT KNEE ARTHROSCOPY WITH PARTIAL MEDIAL MENISECTOMY AND PARTIAL  LATERAL MENISECTOMY;  Surgeon: Elsie Saas, MD;  Location: [place];  Service: Orthopedics;  Laterality: Right;   Patient Active Problem List   Diagnosis Date Noted   Ganglion cyst of dorsum of left wrist 10/13/2020   Acute medial meniscus tear of right knee    Medial meniscus tear    Chronic bronchitis (HCC)     REFERRING DIAG: REFERRING DIAG:  Contusion of left shoulder  Contusion of left lower leg Strain of other specified muscles, fascia and tendons at wrist and hand level, left hand  THERAPY DIAG:  Pain in left leg  Acute pain of left knee  Muscle weakness  (generalized)  Rationale for Evaluation and Treatment Rehabilitation  PERTINENT HISTORY: None  PRECAUTIONS: None   SUBJECTIVE: Patient states she feels good this morning, she does still feel stiff when she gets up. She went back to work after about 5 days off and it took her a little while to get back in the swing of things but it went well.  PAIN:  Are you having pain? No NPRS scale: 0/10 Pain location: Left buttock and posterior thigh, left knee Pain description: Pressure and stabbing sciatica pain, full knee pain Aggravating factors: Movement after sitting extended periods Relieving factors: Stretches, heat/cold modalities   OBJECTIVE: (objective measures completed at initial evaluation unless otherwise dated) PATIENT SURVEYS:  FOTO 69% functional status  08/27/2021: 90% functional status   MUSCLE LENGTH: Slightly limited left hamstring and piriformis   PALPATION: Tender with palpation of left gluteal and piriformis region   LOWER EXTREMITY ROM:   Active ROM Right eval Left eval  Knee flexion 128 125  Knee extension 0 0    LOWER EXTREMITY MMT:   MMT Right eval Left eval Rt / Lt 08/13/2021 Rt / Lt 08/20/2021  Hip flexion 4 4    Hip extension 4- 4-    Hip abduction 4- 3 4- / 3+ 4- / 3+  Knee flexion 5 4    Knee extension 5  4+      GAIT: Assistive device utilized: None Level of assistance: Complete Independence Comments: Slightly antalgic on left     TODAY'S TREATMENT: [facility] Adult PT Treatment:                                                DATE: 08/27/21 Therapeutic Exercise: Nustep L6 x 5 min with UE/LE while taking subjective Supine sciatic nerve floss ankle DF/PF 2 x 10 with 5 sec (left) Piriformis stretch 3 x 20 sec (left) Figure-4 bridge 2 x 15 each 90-90 alternating foot tap x 10 each Sidelying hip abduction 2 x 10 each Sit to stand holding 15# at chest 2 x 10 Dead lift 30# KB 2 x 10 Pallof press with green 2 x 10 each   [facility] Adult PT  Treatment:                                                DATE: 08/20/21 Therapeutic Exercise: Nustep L6 x 5 min with UE/LE while taking subjective Long sitting hamstring stretch 3 x 20 sec (left) Supine sciatic nerve floss ankle DF/PF 2 x 10 with 5 sec (left) Piriformis stretch 3 x 20 sec (left) LTR 5 x 5 sec each Figure-4 bridge 2 x 10 each 90-90 table top hold 10 x 10 sec hold 90-90 alternating foot tap x 10 each Sidelying hip abduction 2 x 10 each Sit to stand holding 15# at chest 2 x 10  [facility] Adult PT Treatment:                                                DATE: 08/13/21 Therapeutic Exercise: Nustep L6 x 5 min with UE/LE while taking subjective Long sitting hamstring stretch 3 x 20 sec (left) Supine active hamstring stretch 10 x 5 sec (left) Supine sciatic nerve floss ankle DF/PF 10 x 5 sec (left) Piriformis stretch 3 x 20 sec (left) Bridge x 10 Sidelying hip abduction 2 x 10 (left) Figure-4 bridge 2 x 10 (left) Side clamshell with green 2 x 15 (left) 90-90 table top hold 2 x 6 with 10 sec hold Sit to stand x 10, holding 10# 2 x 10 SMFR using tennis ball to left hip region   PATIENT EDUCATION:  Education details: HEP update Person educated: Patient Education method: Explanation, Demonstration, Tactile cues, Verbal cues, Handout Education comprehension: verbalized understanding, returned demonstration, verbal cues required, tactile cues required, and needs further education   HOME EXERCISE PROGRAM: Access Code: [email or web address]     ASSESSMENT: CLINICAL IMPRESSION: Patient tolerated therapy well with no adverse effects. She reports great improvement in her functional ability, achieving her FOTO goal. Therapy continues to focus primarily on progression of core strength and stabilization. She is tolerating higher resistance and incorporating lifting this visit with good form and control. Updated HEP to progress strengthening at home. Patient would benefit from continued skilled PT to  progress her mobility and strength in order to reduce pain and maximize functional ability.    OBJECTIVE IMPAIRMENTS Abnormal gait, decreased activity tolerance, decreased strength, impaired flexibility, and pain.  ACTIVITY LIMITATIONS lifting, sitting, standing, and locomotion level   PARTICIPATION LIMITATIONS: community activity and occupation   [place] and Past/current experiences are also affecting patient's functional outcome.      GOALS: Goals reviewed with patient? Yes   SHORT TERM GOALS: Target date: 08/13/2021    Patient will be I with initial HEP in order to progress with therapy. Baseline: HEP provided at eval Status: 08/06/21: independent Goal status: MET   2.  PT will review FOTO with patient by 3rd visit in order to understand expected progress and outcome with therapy. Baseline: FOTO assessed at eval Status: reviewed 08/06/21-2nd visit Goal status: MET   3.  Patient will report pain with activity or work related tasks </= 2/10 in order to reduce functional limitations Baseline: 4/10 Goal status: ONGOING   LONG TERM GOALS: Target date: 09/03/2021    Patient will be I with final HEP to maintain progress from PT. Baseline: HEP provided at eval Goal status: INITIAL   2.  Patient will report >/= 77% status on FOTO to indicate improved functional ability. Baseline: 69% functional status 08/27/2021: 90% functional status Goal status: MET   3.  Patient will demonstrate left knee strength 5/5 MMT and left hip strength >/= 4/5 MMT in order to improve walking tolerance at work. Baseline: patient exhibits strength deficit of left knee and hip Goal status: INITIAL   4.  Patient will report no increase in pain or limitation with work related activities so she can return to prior level of function. Baseline: Patient reports increased pain with work related tasks such as walking Goal status: INITIAL     PLAN: PT FREQUENCY: 1x/week   PT DURATION: 6  weeks   PLANNED INTERVENTIONS: Therapeutic exercises, Therapeutic activity, Neuromuscular re-education, Balance training, Gait training, Patient/Family education, Joint manipulation, Joint mobilization, Aquatic Therapy, Dry Needling, Electrical stimulation, Spinal manipulation, Spinal mobilization, Cryotherapy, Moist heat, Taping, Ionotophoresis 35m/ml Dexamethasone, Manual therapy, and Re-evaluation   PLAN FOR NEXT SESSION: Review HEP and progress PRN, stretching for left hip and hamstring, progressive strengthening for left hip and knee, progressing to closed chain and standing exercises (progress STS) , balance training PRN   CHilda Blades PT, DPT, LAT, ATC 08/27/21  9:16 AM Phone: [phone]: [phone]

## 2021-09-03 ENCOUNTER — Ambulatory Visit: Payer: PRIVATE HEALTH INSURANCE | Attending: Physician Assistant | Admitting: Physical Therapy

## 2021-09-03 ENCOUNTER — Encounter: Payer: Self-pay | Admitting: Physical Therapy

## 2021-09-03 ENCOUNTER — Other Ambulatory Visit: Payer: Self-pay

## 2021-09-03 DIAGNOSIS — M25562 Pain in left knee: Secondary | ICD-10-CM

## 2021-09-03 DIAGNOSIS — M6281 Muscle weakness (generalized): Secondary | ICD-10-CM

## 2021-09-03 DIAGNOSIS — M79605 Pain in left leg: Secondary | ICD-10-CM | POA: Diagnosis present

## 2021-09-03 NOTE — Therapy (Signed)
OUTPATIENT PHYSICAL THERAPY TREATMENT NOTE   Patient Name: Monique Jimenez MRN: [id number] DOB:Jan 22, 1956, 66 y.o., female Today's Date: 09/03/2021  PCP: Lennie Odor, PA   REFERRING PROVIDER: Odis Luster, MD   END OF SESSION:   PT End of Session - 09/03/21 0829     Visit Number 6    Number of Visits 6    Date for PT Re-Evaluation 09/03/21    Authorization Type Worker's Comp    Authorization - Visit Number 6    Authorization - Number of Visits 6    PT Start Time 0830    PT Stop Time 0910    PT Time Calculation (min) 40 min    Activity Tolerance Patient tolerated treatment well    Behavior During Therapy WFL for tasks assessed/performed                 Past Medical History:  Diagnosis Date   Acute medial meniscus tear of right knee    Chronic bronchitis (HCC)    Medial meniscus tear    right   Past Surgical History:  Procedure Laterality Date   BREAST EXCISIONAL BIOPSY Left 01/04/1999   benign   KNEE ARTHROSCOPY WITH LATERAL MENISECTOMY Right 01/11/2015   Procedure: KNEE ARTHROSCOPY WITH LATERAL MENISECTOMY;  Surgeon: Elsie Saas, MD;  Location: [facility];  Service: Orthopedics;  Laterality: Right;   KNEE ARTHROSCOPY WITH MEDIAL MENISECTOMY Right 01/11/2015   Procedure: RIGHT KNEE ARTHROSCOPY WITH PARTIAL MEDIAL MENISECTOMY AND PARTIAL  LATERAL MENISECTOMY;  Surgeon: Elsie Saas, MD;  Location: [facility];  Service: Orthopedics;  Laterality: Right;   Patient Active Problem List   Diagnosis Date Noted   Ganglion cyst of dorsum of left wrist 10/13/2020   Acute medial meniscus tear of right knee    Medial meniscus tear    Chronic bronchitis (HCC)     REFERRING DIAG: REFERRING DIAG:  Contusion of left shoulder  Contusion of left lower leg Strain of other specified muscles, fascia and tendons at wrist and hand level, left hand  THERAPY DIAG:  Pain in left leg  Acute pain of left knee  Muscle weakness  (generalized)  Rationale for Evaluation and Treatment Rehabilitation  PERTINENT HISTORY: None  PRECAUTIONS: None   SUBJECTIVE: Patient reports she has been working and it is going well. No new issues reported. She feels confident with HEP and is ready for discharge. She does note stiffness in the morning and if she sits for extended periods, but once she gets up and moving she feels fine.  PAIN:  Are you having pain? No NPRS scale: 0/10 Pain location: Left buttock and posterior thigh, left knee Pain description: Pressure and stabbing sciatica pain, full knee pain Aggravating factors: Movement after sitting extended periods Relieving factors: Stretches, heat/cold modalities   OBJECTIVE: (objective measures completed at initial evaluation unless otherwise dated) PATIENT SURVEYS:  FOTO 69% functional status  08/27/2021: 90% functional status   MUSCLE LENGTH: WFL   PALPATION: Mildly tender with palpation of left gluteal and piriformis region   LOWER EXTREMITY ROM:   Active ROM Right eval Left eval  Knee flexion 128 125  Knee extension 0 0    LOWER EXTREMITY MMT:   MMT Right eval Left eval Rt / Lt 08/13/2021 Rt / Lt 08/20/2021 Rt / Lt 09/03/2021  Hip flexion 4 4     Hip extension 4- 4-   4 / 4  Hip abduction 4- 3 4- / 3+ 4- / 3+ 4 /  4-  Knee flexion '5 4   5 ' / 5  Knee extension 5 4+   5 / 5    GAIT: Assistive device utilized: None Level of assistance: Complete Independence Comments: Grossly WFL     TODAY'S TREATMENT: [facility] Adult PT Treatment:                                                DATE: 09/03/21 Therapeutic Exercise: Nustep L6 x 5 min with UE/LE while taking subjective Supine sciatic nerve floss ankle DF/PF x 10 with 5 sec (left) Piriformis stretch 2 x 20 sec each Figure-4 bridge 2 x 15 each 90-90 alternating foot tap 2 x 10 each Sidelying hip abduction 2 x 15 each Sit to stand with chair tap 2 x 10 Dead lift 30# KB 2 x 10   [facility] Adult PT Treatment:                                                 DATE: 08/27/21 Therapeutic Exercise: Nustep L6 x 5 min with UE/LE while taking subjective Supine sciatic nerve floss ankle DF/PF 2 x 10 with 5 sec (left) Piriformis stretch 3 x 20 sec (left) Figure-4 bridge 2 x 15 each 90-90 alternating foot tap x 10 each Sidelying hip abduction 2 x 10 each Sit to stand holding 15# at chest 2 x 10 Dead lift 30# KB 2 x 10 Pallof press with green 2 x 10 each  [facility] Adult PT Treatment:                                                DATE: 08/20/21 Therapeutic Exercise: Nustep L6 x 5 min with UE/LE while taking subjective Long sitting hamstring stretch 3 x 20 sec (left) Supine sciatic nerve floss ankle DF/PF 2 x 10 with 5 sec (left) Piriformis stretch 3 x 20 sec (left) LTR 5 x 5 sec each Figure-4 bridge 2 x 10 each 90-90 table top hold 10 x 10 sec hold 90-90 alternating foot tap x 10 each Sidelying hip abduction 2 x 10 each Sit to stand holding 15# at chest 2 x 10   PATIENT EDUCATION:  Education details: POC discharge, HEP Person educated: Patient Education method: Explanation Education comprehension: Verbalized understanding   HOME EXERCISE PROGRAM: Access Code: [email or web address]     ASSESSMENT: CLINICAL IMPRESSION: Patient tolerated therapy well with no adverse effects. She has made great progress in therapy and has achieved all established goals except she does continue to exhibit slight weakness of her left hip musculature. She denies any limitations with work and is independent with her HEP so will be formally discharged from PT and will transition to independent HEP.    OBJECTIVE IMPAIRMENTS Abnormal gait, decreased activity tolerance, decreased strength, impaired flexibility, and pain.    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      GOALS: Goals reviewed with patient? Yes   SHORT TERM GOALS: Target date: 08/13/2021  Patient will be I with initial HEP in order to progress with therapy. Baseline: HEP provided at eval Status: 08/06/21: independent Goal status: MET   2.  PT will review FOTO with patient by 3rd visit in order to understand expected progress and outcome with therapy. Baseline: FOTO assessed at eval Status: reviewed 08/06/21-2nd visit Goal status: MET   3.  Patient will report pain with activity or work related tasks </= 2/10 in order to reduce functional limitations Baseline: 4/10 09/03/2021: patient denies pain with working Goal status: MET   LONG TERM GOALS: Target date: 09/03/2021    Patient will be I with final HEP to maintain progress from PT. Baseline: HEP provided at eval 09/03/2021: independent Goal status: MET   2.  Patient will report >/= 77% status on FOTO to indicate improved functional ability. Baseline: 69% functional status 08/27/2021: 90% functional status Goal status: MET   3.  Patient will demonstrate left knee strength 5/5 MMT and left hip strength >/= 4/5 MMT in order to improve walking tolerance at work. Baseline: patient exhibits strength deficit of left knee and hip 09/03/2021: knee strength 5/5, left hip strength 4-/5 MMT Goal status: PARTIALLY MET   4.  Patient will report no increase in pain or limitation with work related activities so she can return to prior level of function. Baseline: Patient reports increased pain with work related tasks such as walking 09/03/2021: Patient denies pain with work Goal status: MET     PLAN: PT FREQUENCY: 1x/week   PT DURATION: 6 weeks   PLANNED INTERVENTIONS: Therapeutic exercises, Therapeutic activity, Neuromuscular re-education, Balance training, Gait training, Patient/Family education, Joint manipulation, Joint mobilization, Aquatic Therapy, Dry Needling, Electrical stimulation, Spinal manipulation, Spinal  mobilization, Cryotherapy, Moist heat, Taping, Ionotophoresis 36m/ml Dexamethasone, Manual therapy, and Re-evaluation   PLAN FOR NEXT SESSION: NA - discharge   CHilda Blades PT, DPT, LAT, ATC 09/03/21  9:12 AM Phone: [phone]: [phone]  PHYSICAL THERAPY DISCHARGE SUMMARY  Visits from Start of Care: 6  Current functional level related to goals / functional outcomes: See above   Remaining deficits: See above   Education / Equipment: HEP   Patient agrees to discharge. Patient goals were partially met. Patient is being discharged due to being pleased with the current functional level.

## 2021-09-03 NOTE — Patient Instructions (Signed)
Access Code: [email or web address] URL: [email or web address] Date: 09/03/2021 Prepared by: Hilda Blades  Exercises - Supine Sciatic Nerve Glide  - 1 x daily - 2 sets - 10 reps - 5 seconds hold - Supine Piriformis Stretch with Foot on Ground  - 1 x daily - 3 reps - 20 seconds hold - Figure 4 Bridge  - 3 x weekly - 3 sets - 10 reps - Supine 90/90 Alternating Heel Touches with Posterior Pelvic Tilt  - 3 x weekly - 3 sets - 10 reps - Sidelying Hip Abduction  - 3 x weekly - 3 sets - 10 reps - Sit to stand with control  - 3 x weekly - 3 sets - 10 reps - Standing Glute Med Mobilization with Small Ball on Wall  - 1 x daily

## 2021-10-08 ENCOUNTER — Other Ambulatory Visit (HOSPITAL_COMMUNITY): Payer: Self-pay

## 2021-10-08 MED ORDER — IBANDRONATE SODIUM 150 MG PO TABS
ORAL_TABLET | ORAL | 4 refills | Status: DC
  Filled 2021-10-08: qty 3, 90d supply, fill #0
  Filled 2021-12-24: qty 3, 90d supply, fill #1
  Filled 2022-06-10: qty 3, 90d supply, fill #2

## 2021-10-09 ENCOUNTER — Other Ambulatory Visit (HOSPITAL_COMMUNITY): Payer: Self-pay

## 2021-12-24 ENCOUNTER — Other Ambulatory Visit (HOSPITAL_COMMUNITY): Payer: Self-pay

## 2022-01-22 DIAGNOSIS — Z23 Encounter for immunization: Secondary | ICD-10-CM | POA: Diagnosis not present

## 2022-01-22 DIAGNOSIS — Z0000 Encounter for general adult medical examination without abnormal findings: Secondary | ICD-10-CM | POA: Diagnosis not present

## 2022-01-22 DIAGNOSIS — M1990 Unspecified osteoarthritis, unspecified site: Secondary | ICD-10-CM | POA: Diagnosis not present

## 2022-01-22 DIAGNOSIS — E7800 Pure hypercholesterolemia, unspecified: Secondary | ICD-10-CM | POA: Diagnosis not present

## 2022-01-22 DIAGNOSIS — B009 Herpesviral infection, unspecified: Secondary | ICD-10-CM | POA: Diagnosis not present

## 2022-04-08 ENCOUNTER — Other Ambulatory Visit (HOSPITAL_COMMUNITY): Payer: Self-pay

## 2022-04-08 MED ORDER — ATORVASTATIN CALCIUM 10 MG PO TABS
10.0000 mg | ORAL_TABLET | Freq: Every day | ORAL | 2 refills | Status: DC
  Filled 2022-04-08: qty 90, 90d supply, fill #0
  Filled 2023-03-12: qty 90, 90d supply, fill #1

## 2022-04-09 ENCOUNTER — Other Ambulatory Visit (HOSPITAL_COMMUNITY): Payer: Self-pay

## 2022-04-30 DIAGNOSIS — H2513 Age-related nuclear cataract, bilateral: Secondary | ICD-10-CM | POA: Diagnosis not present

## 2022-04-30 DIAGNOSIS — H40023 Open angle with borderline findings, high risk, bilateral: Secondary | ICD-10-CM | POA: Diagnosis not present

## 2022-06-07 ENCOUNTER — Other Ambulatory Visit (HOSPITAL_COMMUNITY): Payer: Self-pay

## 2022-06-07 DIAGNOSIS — L578 Other skin changes due to chronic exposure to nonionizing radiation: Secondary | ICD-10-CM | POA: Diagnosis not present

## 2022-06-07 MED ORDER — TRIAMCINOLONE ACETONIDE 0.1 % EX CREA
1.0000 | TOPICAL_CREAM | Freq: Two times a day (BID) | CUTANEOUS | 3 refills | Status: AC | PRN
  Filled 2022-06-07: qty 80, 30d supply, fill #0
  Filled 2023-04-23: qty 60, 30d supply, fill #1

## 2022-07-01 ENCOUNTER — Other Ambulatory Visit: Payer: Self-pay | Admitting: Physician Assistant

## 2022-07-01 DIAGNOSIS — Z1231 Encounter for screening mammogram for malignant neoplasm of breast: Secondary | ICD-10-CM

## 2022-08-06 ENCOUNTER — Ambulatory Visit
Admission: RE | Admit: 2022-08-06 | Discharge: 2022-08-06 | Disposition: A | Discharge status: Home / Self Care | Payer: Commercial Managed Care - PPO | Source: Ambulatory Visit | Attending: Physician Assistant | Admitting: Physician Assistant

## 2022-08-06 DIAGNOSIS — Z1231 Encounter for screening mammogram for malignant neoplasm of breast: Secondary | ICD-10-CM | POA: Diagnosis not present

## 2022-12-02 ENCOUNTER — Other Ambulatory Visit (HOSPITAL_COMMUNITY): Payer: Self-pay

## 2022-12-02 DIAGNOSIS — M25571 Pain in right ankle and joints of right foot: Secondary | ICD-10-CM | POA: Diagnosis not present

## 2022-12-02 MED ORDER — PREDNISONE 10 MG (21) PO TBPK
ORAL_TABLET | ORAL | 0 refills | Status: AC
  Filled 2022-12-02: qty 21, 6d supply, fill #0

## 2023-01-07 ENCOUNTER — Other Ambulatory Visit (HOSPITAL_COMMUNITY): Payer: Self-pay

## 2023-01-08 ENCOUNTER — Other Ambulatory Visit (HOSPITAL_COMMUNITY): Payer: Self-pay

## 2023-01-08 MED ORDER — IBANDRONATE SODIUM 150 MG PO TABS
150.0000 mg | ORAL_TABLET | ORAL | 4 refills | Status: AC
  Filled 2023-01-08: qty 3, 90d supply, fill #0
  Filled 2023-04-23: qty 3, 90d supply, fill #1
  Filled 2023-07-16: qty 3, 90d supply, fill #2
  Filled 2024-01-01: qty 3, 90d supply, fill #3

## 2023-01-24 DIAGNOSIS — Z0000 Encounter for general adult medical examination without abnormal findings: Secondary | ICD-10-CM | POA: Diagnosis not present

## 2023-01-24 DIAGNOSIS — B009 Herpesviral infection, unspecified: Secondary | ICD-10-CM | POA: Diagnosis not present

## 2023-01-24 DIAGNOSIS — E7800 Pure hypercholesterolemia, unspecified: Secondary | ICD-10-CM | POA: Diagnosis not present

## 2023-01-24 DIAGNOSIS — M8588 Other specified disorders of bone density and structure, other site: Secondary | ICD-10-CM | POA: Diagnosis not present

## 2023-01-24 DIAGNOSIS — Z13820 Encounter for screening for osteoporosis: Secondary | ICD-10-CM | POA: Diagnosis not present

## 2023-03-18 DIAGNOSIS — M8588 Other specified disorders of bone density and structure, other site: Secondary | ICD-10-CM | POA: Diagnosis not present

## 2023-04-23 ENCOUNTER — Other Ambulatory Visit (HOSPITAL_COMMUNITY): Payer: Self-pay

## 2023-04-23 MED ORDER — ATORVASTATIN CALCIUM 10 MG PO TABS
10.0000 mg | ORAL_TABLET | Freq: Every day | ORAL | 1 refills | Status: DC
  Filled 2023-06-02: qty 90, 90d supply, fill #0
  Filled 2023-10-09: qty 90, 90d supply, fill #1

## 2023-04-25 ENCOUNTER — Other Ambulatory Visit (HOSPITAL_COMMUNITY): Payer: Self-pay

## 2023-05-16 ENCOUNTER — Other Ambulatory Visit: Payer: Self-pay | Admitting: Physician Assistant

## 2023-05-16 DIAGNOSIS — Z1231 Encounter for screening mammogram for malignant neoplasm of breast: Secondary | ICD-10-CM

## 2023-06-02 ENCOUNTER — Other Ambulatory Visit (HOSPITAL_COMMUNITY): Payer: Self-pay

## 2023-06-02 DIAGNOSIS — H2513 Age-related nuclear cataract, bilateral: Secondary | ICD-10-CM | POA: Diagnosis not present

## 2023-06-02 DIAGNOSIS — H40023 Open angle with borderline findings, high risk, bilateral: Secondary | ICD-10-CM | POA: Diagnosis not present

## 2023-07-16 ENCOUNTER — Other Ambulatory Visit (HOSPITAL_COMMUNITY): Payer: Self-pay

## 2023-07-28 DIAGNOSIS — E7800 Pure hypercholesterolemia, unspecified: Secondary | ICD-10-CM | POA: Diagnosis not present

## 2023-08-08 ENCOUNTER — Ambulatory Visit
Admission: RE | Admit: 2023-08-08 | Discharge: 2023-08-08 | Disposition: A | Discharge status: Home / Self Care | Source: Ambulatory Visit | Attending: Physician Assistant | Admitting: Physician Assistant

## 2023-08-08 DIAGNOSIS — Z1231 Encounter for screening mammogram for malignant neoplasm of breast: Secondary | ICD-10-CM | POA: Diagnosis not present

## 2023-10-28 DIAGNOSIS — E7800 Pure hypercholesterolemia, unspecified: Secondary | ICD-10-CM | POA: Diagnosis not present

## 2023-11-07 ENCOUNTER — Other Ambulatory Visit (HOSPITAL_COMMUNITY): Payer: Self-pay

## 2023-11-07 MED ORDER — AMOXICILLIN 875 MG PO TABS
875.0000 mg | ORAL_TABLET | Freq: Two times a day (BID) | ORAL | 0 refills | Status: AC
  Filled 2023-11-07: qty 20, 10d supply, fill #0

## 2023-11-07 MED ORDER — TRAMADOL HCL 50 MG PO TABS
100.0000 mg | ORAL_TABLET | Freq: Two times a day (BID) | ORAL | 0 refills | Status: AC
  Filled 2023-11-07: qty 24, 6d supply, fill #0

## 2023-11-20 ENCOUNTER — Other Ambulatory Visit (HOSPITAL_COMMUNITY): Payer: Self-pay

## 2023-11-20 DIAGNOSIS — J0140 Acute pansinusitis, unspecified: Secondary | ICD-10-CM | POA: Diagnosis not present

## 2023-11-20 MED ORDER — FLUCONAZOLE 100 MG PO TABS
100.0000 mg | ORAL_TABLET | ORAL | 0 refills | Status: AC
  Filled 2023-11-20: qty 2, 3d supply, fill #0

## 2023-11-20 MED ORDER — AMOXICILLIN-POT CLAVULANATE 875-125 MG PO TABS
1.0000 | ORAL_TABLET | Freq: Two times a day (BID) | ORAL | 0 refills | Status: AC
  Filled 2023-11-20: qty 14, 7d supply, fill #0

## 2023-11-28 ENCOUNTER — Other Ambulatory Visit (HOSPITAL_COMMUNITY): Payer: Self-pay

## 2023-11-28 MED ORDER — AMOXICILLIN-POT CLAVULANATE 875-125 MG PO TABS
1.0000 | ORAL_TABLET | Freq: Two times a day (BID) | ORAL | 0 refills | Status: AC
  Filled 2023-11-28: qty 6, 3d supply, fill #0

## 2023-12-02 ENCOUNTER — Other Ambulatory Visit (HOSPITAL_COMMUNITY): Payer: Self-pay

## 2023-12-02 MED ORDER — FLUTICASONE PROPIONATE 50 MCG/ACT NA SUSP
2.0000 | Freq: Every day | NASAL | 11 refills | Status: AC
  Filled 2023-12-02: qty 16, 30d supply, fill #0
  Filled 2023-12-28: qty 16, 30d supply, fill #1

## 2023-12-08 ENCOUNTER — Other Ambulatory Visit (HOSPITAL_COMMUNITY): Payer: Self-pay

## 2023-12-08 MED ORDER — FLUCONAZOLE 150 MG PO TABS
150.0000 mg | ORAL_TABLET | Freq: Every day | ORAL | 0 refills | Status: AC
  Filled 2023-12-08: qty 2, 7d supply, fill #0

## 2023-12-30 ENCOUNTER — Other Ambulatory Visit (HOSPITAL_COMMUNITY): Payer: Self-pay

## 2023-12-30 MED ORDER — ATORVASTATIN CALCIUM 10 MG PO TABS
10.0000 mg | ORAL_TABLET | Freq: Every day | ORAL | 0 refills | Status: AC
  Filled 2023-12-30: qty 90, 90d supply, fill #0

## 2024-02-06 ENCOUNTER — Encounter: Payer: Self-pay | Admitting: Neurology

## 2024-02-09 ENCOUNTER — Other Ambulatory Visit (HOSPITAL_COMMUNITY): Payer: Self-pay

## 2024-02-09 MED ORDER — GABAPENTIN 100 MG PO CAPS
100.0000 mg | ORAL_CAPSULE | Freq: Two times a day (BID) | ORAL | 0 refills | Status: AC | PRN
  Filled 2024-02-09: qty 60, 15d supply, fill #0

## 2024-05-03 ENCOUNTER — Ambulatory Visit: Payer: Self-pay | Admitting: Neurology
